# Patient Record
Sex: Male | Born: 1945
Health system: Southern US, Community
[De-identification: ages and names within clinical notes are randomized; demographics above are authoritative.]

## PROBLEM LIST (undated history)

## (undated) DIAGNOSIS — R42 Dizziness and giddiness: Secondary | ICD-10-CM

## (undated) DIAGNOSIS — I10 Essential (primary) hypertension: Secondary | ICD-10-CM

## (undated) DIAGNOSIS — I493 Ventricular premature depolarization: Secondary | ICD-10-CM

## (undated) DIAGNOSIS — I491 Atrial premature depolarization: Secondary | ICD-10-CM

## (undated) HISTORY — DX: Essential (primary) hypertension: I10

## (undated) HISTORY — DX: Atrial premature depolarization: I49.1

## (undated) HISTORY — PX: NO PAST SURGERIES: SHX2092

## (undated) HISTORY — DX: Ventricular premature depolarization: I49.3

---

## 1998-02-19 ENCOUNTER — Ambulatory Visit (HOSPITAL_COMMUNITY): Admission: RE | Admit: 1998-02-19 | Discharge: 1998-02-19 | Payer: Self-pay | Admitting: *Deleted

## 2000-07-17 ENCOUNTER — Emergency Department (HOSPITAL_COMMUNITY): Admission: EM | Admit: 2000-07-17 | Discharge: 2000-07-17 | Payer: Self-pay | Admitting: Emergency Medicine

## 2005-06-02 ENCOUNTER — Ambulatory Visit (HOSPITAL_COMMUNITY): Admission: RE | Admit: 2005-06-02 | Discharge: 2005-06-02 | Payer: Self-pay | Admitting: *Deleted

## 2005-06-02 ENCOUNTER — Encounter (INDEPENDENT_AMBULATORY_CARE_PROVIDER_SITE_OTHER): Payer: Self-pay | Admitting: *Deleted

## 2009-02-26 ENCOUNTER — Encounter: Admission: RE | Admit: 2009-02-26 | Discharge: 2009-02-26 | Payer: Self-pay | Admitting: Internal Medicine

## 2009-06-11 ENCOUNTER — Encounter (INDEPENDENT_AMBULATORY_CARE_PROVIDER_SITE_OTHER): Payer: Self-pay | Admitting: Internal Medicine

## 2009-06-11 ENCOUNTER — Ambulatory Visit (HOSPITAL_COMMUNITY): Admission: RE | Admit: 2009-06-11 | Discharge: 2009-06-11 | Payer: Self-pay | Admitting: Internal Medicine

## 2010-11-14 NOTE — Op Note (Signed)
Patrick, Morgan NO.:  [id number]   MEDICAL RECORD NO.:  [id number]          PATIENT TYPE:  AMB   LOCATION:  ENDO                         FACILITY:  [facility]   PHYSICIAN:  Georgiana Spinner, M.D.    DATE OF BIRTH:  Jan 11, 1946   DATE OF PROCEDURE:  06/02/2005  DATE OF DISCHARGE:                                 OPERATIVE REPORT   PROCEDURE:  Colonoscopy.   INDICATIONS FOR PROCEDURE:  Hemoccult positivity.   ANESTHESIA:  Demerol 25, Versed 2 mg.   PROCEDURE:  With the patient mildly sedated in the left lateral decubitus  position, a rectal exam was performed which was unremarkable.  Subsequently,  the Olympus videoscopic colonoscope was inserted in the rectum and passed  under direct vision to the cecum identified by the ileocecal valve and  appendiceal orifice, both of which were photographed.  From this point, the  colonoscope was slowly withdrawn taking circumferential views of the entire  colonic mucosa stopping at 30 cm from the anal verge at which point a small  polyp was seen, photographed, and removed using hot biopsy forceps at a mix  setting of 20/200 current.  We then withdrew all the way to the rectum which  appeared normal on direct and retroflex view.  The endoscope was  straightened and withdrawn.  The patient's vital signs and pulse oximeter  remained stable.  The patient tolerated the procedure well without apparent  complications.   FINDINGS:  Small polyp at 30 cm from the anal verge, await biopsy report.   PLAN:  The patient will go home and follow up with me as an outpatient.           ______________________________  Georgiana Spinner, M.D.     GMO/MEDQ  D:  06/02/2005  T:  06/02/2005  Job:  [id number]

## 2010-11-14 NOTE — Op Note (Signed)
Patrick Morgan, HORNIG NO.:  [id number]   MEDICAL RECORD NO.:  [id number]          PATIENT TYPE:  AMB   LOCATION:  ENDO                         FACILITY:  [facility]   PHYSICIAN:  Georgiana Spinner, M.D.    DATE OF BIRTH:  Apr 10, 1946   DATE OF PROCEDURE:  06/02/2005  DATE OF DISCHARGE:                                 OPERATIVE REPORT   PROCEDURE:  Upper endoscopy.   INDICATIONS FOR PROCEDURE:  Hemoccult positivity.   ANESTHESIA:  Demerol 75 mg, Versed 6 mg.   PROCEDURE:  With the patient mildly sedated in the left lateral decubitus  position, the Olympus videoscopic endoscope was inserted in the mouth and  passed under direct vision through the esophagus which appeared normal into  the stomach.  The fundus, body, and antrum were visualized and there were  small flecks of blood seen in the stomach and diffuse, mild erythema, which  was photographed and biopsied.  We entered into the duodenal bulb and second  portion of the duodenum and both appeared normal.  From this point, the  endoscope was slowly withdrawn taking circumferential views of the duodenal  mucosa until the endoscope was pulled back into the stomach and placed in  retroflexion to view the stomach from below.  The endoscope was then  straightened and withdrawn taking circumferential views of the remaining  gastric and esophageal mucosa.  The patient's vital signs and pulse oximeter  remained stable.  The patient tolerated the procedure well without apparent  complications.   FINDINGS:  Erythema with some blood flecks in the stomach, may be the cause  of the patient's hemoccult positivity, await biopsy report.  The patient  will call for results and follow up with me as an outpatient.  Proceed to  colonoscopy.           ______________________________  Georgiana Spinner, M.D.     GMO/MEDQ  D:  06/02/2005  T:  06/02/2005  Job:  [id number]

## 2011-07-10 DIAGNOSIS — Z7902 Long term (current) use of antithrombotics/antiplatelets: Secondary | ICD-10-CM | POA: Diagnosis not present

## 2011-07-10 DIAGNOSIS — I10 Essential (primary) hypertension: Secondary | ICD-10-CM | POA: Diagnosis not present

## 2011-07-10 DIAGNOSIS — Z125 Encounter for screening for malignant neoplasm of prostate: Secondary | ICD-10-CM | POA: Diagnosis not present

## 2011-07-15 DIAGNOSIS — Z23 Encounter for immunization: Secondary | ICD-10-CM | POA: Diagnosis not present

## 2011-07-15 DIAGNOSIS — Z0000 Encounter for general adult medical examination without abnormal findings: Secondary | ICD-10-CM | POA: Diagnosis not present

## 2012-03-20 DIAGNOSIS — Z23 Encounter for immunization: Secondary | ICD-10-CM | POA: Diagnosis not present

## 2012-06-09 ENCOUNTER — Encounter: Payer: Self-pay | Admitting: Internal Medicine

## 2012-06-09 ENCOUNTER — Other Ambulatory Visit (INDEPENDENT_AMBULATORY_CARE_PROVIDER_SITE_OTHER): Payer: Medicare Other

## 2012-06-09 ENCOUNTER — Ambulatory Visit (INDEPENDENT_AMBULATORY_CARE_PROVIDER_SITE_OTHER): Payer: Medicare Other | Admitting: Internal Medicine

## 2012-06-09 VITALS — BP 152/84 | HR 84 | Temp 97.6°F | Resp 16 | Ht 70.5 in | Wt 204.0 lb

## 2012-06-09 DIAGNOSIS — I10 Essential (primary) hypertension: Secondary | ICD-10-CM | POA: Diagnosis not present

## 2012-06-09 DIAGNOSIS — R972 Elevated prostate specific antigen [PSA]: Secondary | ICD-10-CM

## 2012-06-09 DIAGNOSIS — M25561 Pain in right knee: Secondary | ICD-10-CM

## 2012-06-09 DIAGNOSIS — M25569 Pain in unspecified knee: Secondary | ICD-10-CM | POA: Diagnosis not present

## 2012-06-09 DIAGNOSIS — Z0000 Encounter for general adult medical examination without abnormal findings: Secondary | ICD-10-CM | POA: Diagnosis not present

## 2012-06-09 DIAGNOSIS — Z23 Encounter for immunization: Secondary | ICD-10-CM | POA: Diagnosis not present

## 2012-06-09 DIAGNOSIS — Z136 Encounter for screening for cardiovascular disorders: Secondary | ICD-10-CM

## 2012-06-09 LAB — CBC WITH DIFFERENTIAL/PLATELET
Basophils Absolute: 0 10*3/uL (ref 0.0–0.1)
Hemoglobin: 14.9 g/dL (ref 13.0–17.0)
Lymphocytes Relative: 18.6 % (ref 12.0–46.0)
Monocytes Relative: 8.1 % (ref 3.0–12.0)
Neutro Abs: 5.5 10*3/uL (ref 1.4–7.7)
Neutrophils Relative %: 71.2 % (ref 43.0–77.0)
RBC: 4.33 Mil/uL (ref 4.22–5.81)
RDW: 13.3 % (ref 11.5–14.6)

## 2012-06-09 LAB — URINALYSIS
Bilirubin Urine: NEGATIVE
Hgb urine dipstick: NEGATIVE
Ketones, ur: NEGATIVE
Total Protein, Urine: NEGATIVE
Urine Glucose: NEGATIVE
Urobilinogen, UA: 0.2 (ref 0.0–1.0)

## 2012-06-09 LAB — LIPID PANEL
HDL: 42.9 mg/dL (ref >39.00)
Triglycerides: 98 mg/dL (ref 0.0–149.0)
VLDL: 19.6 mg/dL (ref 0.0–40.0)

## 2012-06-09 LAB — BASIC METABOLIC PANEL
Calcium: 9.8 mg/dL (ref 8.4–10.5)
GFR: 107.12 mL/min (ref >60.00)
Glucose, Bld: 111 mg/dL — ABNORMAL HIGH (ref 70–99)
Sodium: 137 mEq/L (ref 135–145)

## 2012-06-09 LAB — HEPATIC FUNCTION PANEL
ALT: 25 U/L (ref 0–53)
Albumin: 4.6 g/dL (ref 3.5–5.2)
Total Bilirubin: 0.8 mg/dL (ref 0.3–1.2)
Total Protein: 7.5 g/dL (ref 6.0–8.3)

## 2012-06-09 LAB — TSH: TSH: 1.26 u[IU]/mL (ref 0.35–5.50)

## 2012-06-09 LAB — PSA: PSA: 2.86 ng/mL (ref 0.10–4.00)

## 2012-06-09 MED ORDER — OLMESARTAN MEDOXOMIL 40 MG PO TABS: 40.0000 mg | ORAL_TABLET | Freq: Every day | ORAL | Status: DC

## 2012-06-09 MED ORDER — VITAMIN D 1000 UNITS PO TABS: 1000.0000 [IU] | ORAL_TABLET | Freq: Every day | ORAL | Status: AC

## 2012-06-09 MED ORDER — IBUPROFEN 600 MG PO TABS: 600.0000 mg | ORAL_TABLET | Freq: Three times a day (TID) | ORAL | Status: DC | PRN

## 2012-06-09 NOTE — Assessment & Plan Note (Signed)
Here for medicare wellness/physical  Diet: heart healthy  Physical activity: active Depression/mood screen: negative  Hearing: intact to whispered voice  Visual acuity: grossly normal, performs annual eye exam  ADLs: capable  Fall risk: none  Home safety: good  Cognitive evaluation: intact to orientation, naming, recall and repetition  EOL planning: adv directives, full code/ I agree  I have personally reviewed and have noted  1. The patient's medical and social history  2. Their use of alcohol, tobacco or illicit drugs  3. Their current medications and supplements  4. The patient's functional ability including ADL's, fall risks, home safety risks and hearing or visual impairment.  5. Diet and physical activities  6. Evidence for depression or mood disorders    Today patient counseled on age appropriate routine health concerns for screening and prevention, each reviewed and up to date or declined. Immunizations reviewed and up to date or declined. Labs ordered and reviewed. Risk factors for depression reviewed and negative. Hearing function and visual acuity are intact. ADLs screened and addressed as needed. Functional ability and level of safety reviewed and appropriate. Education, counseling and referrals performed based on assessed risks today. Patient provided with a copy of personalized plan for preventive services.     

## 2012-06-09 NOTE — Assessment & Plan Note (Signed)
Chronic - nl BP at home Continue with current prescription therapy as reflected on the Med list.

## 2012-06-09 NOTE — Progress Notes (Signed)
  Subjective:    Patient ID: Patrick Morgan, male    DOB: 10/17/1945, 66 y.o.   MRN: [id number]  HPI  The patient is here for a wellness exam. The patient has been doing well overall without major physical or psychological issues going on lately. C/o OA, knee pain F/u HTN  BP Readings from Last 3 Encounters:  06/09/12 152/84   Wt Readings from Last 3 Encounters:  06/09/12 204 lb (92.534 kg)      Review of Systems  Constitutional: Negative for appetite change, fatigue and unexpected weight change.  HENT: Negative for nosebleeds, congestion, sore throat, sneezing, trouble swallowing and neck pain.   Eyes: Negative for itching and visual disturbance.  Respiratory: Negative for cough.   Cardiovascular: Negative for chest pain, palpitations and leg swelling.  Gastrointestinal: Negative for nausea, diarrhea, blood in stool and abdominal distention.  Genitourinary: Negative for frequency and hematuria.  Musculoskeletal: Negative for back pain, joint swelling and gait problem.  Skin: Negative for rash.  Neurological: Negative for dizziness, tremors, speech difficulty and weakness.  Psychiatric/Behavioral: Negative for suicidal ideas, sleep disturbance, dysphoric mood and agitation. The patient is not nervous/anxious.        Objective:   Physical Exam  Constitutional: He is oriented to person, place, and time. He appears well-developed and well-nourished. No distress.  HENT:  Head: Normocephalic and atraumatic.  Right Ear: External ear normal.  Left Ear: External ear normal.  Nose: Nose normal.  Mouth/Throat: Oropharynx is clear and moist. No oropharyngeal exudate.  Eyes: Conjunctivae normal and EOM are normal. Pupils are equal, round, and reactive to light. Right eye exhibits no discharge. Left eye exhibits no discharge. No scleral icterus.  Neck: Normal range of motion. Neck supple. No JVD present. No tracheal deviation present. No thyromegaly present.  Cardiovascular: Normal  rate, regular rhythm, normal heart sounds and intact distal pulses.  Exam reveals no gallop and no friction rub.   No murmur heard. Pulmonary/Chest: Effort normal and breath sounds normal. No stridor. No respiratory distress. He has no wheezes. He has no rales. He exhibits no tenderness.  Abdominal: Soft. Bowel sounds are normal. He exhibits no distension and no mass. There is no tenderness. There is no rebound and no guarding.  Genitourinary: Rectum normal, prostate normal and penis normal. Guaiac negative stool. No penile tenderness.  Musculoskeletal: Normal range of motion. He exhibits no edema and no tenderness.  Lymphadenopathy:    He has no cervical adenopathy.  Neurological: He is alert and oriented to person, place, and time. He has normal reflexes. No cranial nerve deficit. He exhibits normal muscle tone. Coordination normal.  Skin: Skin is warm and dry. No rash noted. He is not diaphoretic. No erythema. No pallor.  Psychiatric: He has a normal mood and affect. His behavior is normal. Judgment and thought content normal.   No results found for this basename: WBC, HGB, HCT, PLT, GLUCOSE, CHOL, TRIG, HDL, LDLDIRECT, LDLCALC, ALT, AST, NA, K, CL, CREATININE, BUN, CO2, TSH, PSA, INR, GLUF, HGBA1C, MICROALBUR          Assessment & Plan:

## 2013-02-01 ENCOUNTER — Other Ambulatory Visit: Payer: Self-pay

## 2013-04-05 DIAGNOSIS — Z23 Encounter for immunization: Secondary | ICD-10-CM | POA: Diagnosis not present

## 2013-06-14 ENCOUNTER — Other Ambulatory Visit (INDEPENDENT_AMBULATORY_CARE_PROVIDER_SITE_OTHER): Payer: Medicare Other

## 2013-06-14 ENCOUNTER — Encounter: Payer: Self-pay | Admitting: Internal Medicine

## 2013-06-14 ENCOUNTER — Ambulatory Visit (INDEPENDENT_AMBULATORY_CARE_PROVIDER_SITE_OTHER): Payer: Medicare Other | Admitting: Internal Medicine

## 2013-06-14 VITALS — BP 148/70 | Temp 98.0°F | Ht 70.5 in | Wt 202.0 lb

## 2013-06-14 DIAGNOSIS — I10 Essential (primary) hypertension: Secondary | ICD-10-CM

## 2013-06-14 DIAGNOSIS — Z0000 Encounter for general adult medical examination without abnormal findings: Secondary | ICD-10-CM

## 2013-06-14 DIAGNOSIS — Z136 Encounter for screening for cardiovascular disorders: Secondary | ICD-10-CM

## 2013-06-14 DIAGNOSIS — R209 Unspecified disturbances of skin sensation: Secondary | ICD-10-CM

## 2013-06-14 DIAGNOSIS — R972 Elevated prostate specific antigen [PSA]: Secondary | ICD-10-CM | POA: Diagnosis not present

## 2013-06-14 DIAGNOSIS — R202 Paresthesia of skin: Secondary | ICD-10-CM

## 2013-06-14 DIAGNOSIS — Z23 Encounter for immunization: Secondary | ICD-10-CM | POA: Diagnosis not present

## 2013-06-14 DIAGNOSIS — E785 Hyperlipidemia, unspecified: Secondary | ICD-10-CM | POA: Diagnosis not present

## 2013-06-14 LAB — LIPID PANEL
LDL Cholesterol: 123 mg/dL — ABNORMAL HIGH (ref 0–99)
Total CHOL/HDL Ratio: 4
VLDL: 9.4 mg/dL (ref 0.0–40.0)

## 2013-06-14 LAB — CBC WITH DIFFERENTIAL/PLATELET
Basophils Relative: 0.5 % (ref 0.0–3.0)
Eosinophils Absolute: 0.1 10*3/uL (ref 0.0–0.7)
Eosinophils Relative: 1.3 % (ref 0.0–5.0)
HCT: 45.5 % (ref 39.0–52.0)
Lymphs Abs: 1.3 10*3/uL (ref 0.7–4.0)
MCHC: 33.8 g/dL (ref 30.0–36.0)
MCV: 101.1 fl — ABNORMAL HIGH (ref 78.0–100.0)
Monocytes Absolute: 0.4 10*3/uL (ref 0.1–1.0)
Neutrophils Relative %: 70.1 % (ref 43.0–77.0)
Platelets: 193 10*3/uL (ref 150.0–400.0)
RBC: 4.5 Mil/uL (ref 4.22–5.81)
WBC: 5.9 10*3/uL (ref 4.5–10.5)

## 2013-06-14 LAB — BASIC METABOLIC PANEL
BUN: 22 mg/dL (ref 6–23)
CO2: 25 mEq/L (ref 19–32)
Chloride: 107 mEq/L (ref 96–112)
Creatinine, Ser: 0.8 mg/dL (ref 0.4–1.5)
Potassium: 3.8 mEq/L (ref 3.5–5.1)

## 2013-06-14 LAB — URINALYSIS
Nitrite: NEGATIVE
Total Protein, Urine: NEGATIVE
Urine Glucose: NEGATIVE
pH: 6 (ref 5.0–8.0)

## 2013-06-14 LAB — HEPATIC FUNCTION PANEL
Alkaline Phosphatase: 78 U/L (ref 39–117)
Bilirubin, Direct: 0.1 mg/dL (ref 0.0–0.3)

## 2013-06-14 LAB — PSA, TOTAL AND FREE: PSA, Free: 0.35 ng/mL

## 2013-06-14 MED ORDER — OLMESARTAN MEDOXOMIL 40 MG PO TABS: 40.0000 mg | ORAL_TABLET | Freq: Every day | ORAL | Status: DC

## 2013-06-14 MED ORDER — VITAMIN D 1000 UNITS PO TABS: 1000.0000 [IU] | ORAL_TABLET | Freq: Every day | ORAL | Status: AC

## 2013-06-14 NOTE — Progress Notes (Signed)
   Subjective:    HPI  The patient is here for a wellness exam. The patient has been doing well overall without major physical or psychological issues going on lately. C/o R big toe, foot bottom and shin numbness at times x 1 mo  F/u HTN  BP Readings from Last 3 Encounters:  06/14/13 148/70  06/09/12 152/84   Wt Readings from Last 3 Encounters:  06/14/13 202 lb (91.627 kg)  06/09/12 204 lb (92.534 kg)      Review of Systems  Constitutional: Negative for appetite change, fatigue and unexpected weight change.  HENT: Negative for congestion, nosebleeds, sneezing, sore throat and trouble swallowing.   Eyes: Negative for itching and visual disturbance.  Respiratory: Negative for cough.   Cardiovascular: Negative for chest pain, palpitations and leg swelling.  Gastrointestinal: Negative for nausea, diarrhea, blood in stool and abdominal distention.  Genitourinary: Negative for frequency and hematuria.  Musculoskeletal: Negative for back pain, gait problem, joint swelling and neck pain.  Skin: Negative for rash.  Neurological: Negative for dizziness, tremors, speech difficulty and weakness.  Psychiatric/Behavioral: Negative for suicidal ideas, sleep disturbance, dysphoric mood and agitation. The patient is not nervous/anxious.        Objective:   Physical Exam  Constitutional: He is oriented to person, place, and time. He appears well-developed and well-nourished. No distress.  HENT:  Head: Normocephalic and atraumatic.  Right Ear: External ear normal.  Left Ear: External ear normal.  Nose: Nose normal.  Mouth/Throat: Oropharynx is clear and moist. No oropharyngeal exudate.  Eyes: Conjunctivae and EOM are normal. Pupils are equal, round, and reactive to light. Right eye exhibits no discharge. Left eye exhibits no discharge. No scleral icterus.  Neck: Normal range of motion. Neck supple. No JVD present. No tracheal deviation present. No thyromegaly present.  Cardiovascular:  Normal rate, regular rhythm, normal heart sounds and intact distal pulses.  Exam reveals no gallop and no friction rub.   No murmur heard. Pulmonary/Chest: Effort normal and breath sounds normal. No stridor. No respiratory distress. He has no wheezes. He has no rales. He exhibits no tenderness.  Abdominal: Soft. Bowel sounds are normal. He exhibits no distension and no mass. There is no tenderness. There is no rebound and no guarding.  Genitourinary: Rectum normal, prostate normal and penis normal. Guaiac negative stool. No penile tenderness.  Musculoskeletal: Normal range of motion. He exhibits no edema and no tenderness.  Lymphadenopathy:    He has no cervical adenopathy.  Neurological: He is alert and oriented to person, place, and time. He has normal reflexes. No cranial nerve deficit. He exhibits normal muscle tone. Coordination normal.  Skin: Skin is warm and dry. No rash noted. He is not diaphoretic. No erythema. No pallor.  Psychiatric: He has a normal mood and affect. His behavior is normal. Judgment and thought content normal.   Lab Results  Component Value Date   WBC 7.7 06/09/2012          Assessment & Plan:

## 2013-06-14 NOTE — Assessment & Plan Note (Addendum)
12/14 R peroneal nerve distribution - ?pressure induced Relief pressure when driving

## 2013-06-14 NOTE — Assessment & Plan Note (Addendum)
Here for medicare wellness/physical  Diet: heart healthy  Physical activity: active Depression/mood screen: negative  Hearing: intact to whispered voice  Visual acuity: grossly normal, performs annual eye exam  ADLs: capable  Fall risk: none  Home safety: good  Cognitive evaluation: intact to orientation, naming, recall and repetition  EOL planning: adv directives, full code/ I agree  I have personally reviewed and have noted  1. The patient's medical and social history  2. Their use of alcohol, tobacco or illicit drugs  3. Their current medications and supplements  4. The patient's functional ability including ADL's, fall risks, home safety risks and hearing or visual impairment.  5. Diet and physical activities  6. Evidence for depression or mood disorders    Today patient counseled on age appropriate routine health concerns for screening and prevention, each reviewed and up to date or declined. Immunizations reviewed and up to date or declined. Labs ordered and reviewed. Risk factors for depression reviewed and negative. Hearing function and visual acuity are intact. ADLs screened and addressed as needed. Functional ability and level of safety reviewed and appropriate. Education, counseling and referrals performed based on assessed risks today. Patient provided with a copy of personalized plan for preventive services.   Colon w/Dr Loreta Ave

## 2013-06-14 NOTE — Progress Notes (Signed)
Pre visit review using our clinic review tool, if applicable. No additional management support is needed unless otherwise documented below in the visit note. 

## 2013-06-15 ENCOUNTER — Encounter: Payer: Self-pay | Admitting: Internal Medicine

## 2013-06-15 NOTE — Assessment & Plan Note (Signed)
Continue with current prescription therapy as reflected on the Med list.  

## 2013-06-15 NOTE — Assessment & Plan Note (Signed)
PSA and free PSA Urol f/u was recomended

## 2013-09-15 ENCOUNTER — Other Ambulatory Visit: Payer: Self-pay | Admitting: Internal Medicine

## 2013-09-15 NOTE — Telephone Encounter (Signed)
Pt called stated that benicar is too expensive. Pt request for Dr. Camila Li to send something that is cheaper. The [facility] recommend Losartin and Hyzaar. Please advise. Pt also request sample for Benicar for the time being.

## 2013-09-17 NOTE — Telephone Encounter (Signed)
Ok Micardis Thx

## 2013-09-18 MED ORDER — TELMISARTAN 80 MG PO TABS: 80.0000 mg | ORAL_TABLET | Freq: Every day | ORAL | Status: DC

## 2013-09-19 ENCOUNTER — Telehealth: Payer: Self-pay

## 2013-09-19 NOTE — Telephone Encounter (Signed)
Received pharmacy rejection stating that insurance will not cover micarwithout a prior authorization. Preferred alternatives are benicar, exforge, azor or tribenzor. Pt has failed benicar, and PA submitted.

## 2014-03-23 DIAGNOSIS — Z23 Encounter for immunization: Secondary | ICD-10-CM | POA: Diagnosis not present

## 2014-06-15 ENCOUNTER — Other Ambulatory Visit (INDEPENDENT_AMBULATORY_CARE_PROVIDER_SITE_OTHER): Payer: Medicare Other

## 2014-06-15 ENCOUNTER — Ambulatory Visit (INDEPENDENT_AMBULATORY_CARE_PROVIDER_SITE_OTHER): Payer: Medicare Other | Admitting: Internal Medicine

## 2014-06-15 ENCOUNTER — Encounter: Payer: Self-pay | Admitting: Internal Medicine

## 2014-06-15 VITALS — BP 180/88 | HR 87 | Temp 98.1°F | Ht 70.0 in | Wt 211.0 lb

## 2014-06-15 DIAGNOSIS — E785 Hyperlipidemia, unspecified: Secondary | ICD-10-CM

## 2014-06-15 DIAGNOSIS — Z0000 Encounter for general adult medical examination without abnormal findings: Secondary | ICD-10-CM | POA: Diagnosis not present

## 2014-06-15 DIAGNOSIS — I10 Essential (primary) hypertension: Secondary | ICD-10-CM

## 2014-06-15 DIAGNOSIS — N320 Bladder-neck obstruction: Secondary | ICD-10-CM

## 2014-06-15 LAB — CBC WITH DIFFERENTIAL/PLATELET
Basophils Absolute: 0 10*3/uL (ref 0.0–0.1)
Basophils Relative: 0.5 % (ref 0.0–3.0)
EOS PCT: 0.9 % (ref 0.0–5.0)
Eosinophils Absolute: 0.1 10*3/uL (ref 0.0–0.7)
HCT: 46.7 % (ref 39.0–52.0)
HEMOGLOBIN: 15.8 g/dL (ref 13.0–17.0)
Lymphocytes Relative: 17.5 % (ref 12.0–46.0)
Lymphs Abs: 1.3 10*3/uL (ref 0.7–4.0)
MCHC: 33.9 g/dL (ref 30.0–36.0)
MCV: 100.7 fl — AB (ref 78.0–100.0)
MONO ABS: 0.5 10*3/uL (ref 0.1–1.0)
Monocytes Relative: 7 % (ref 3.0–12.0)
Neutro Abs: 5.5 10*3/uL (ref 1.4–7.7)
Neutrophils Relative %: 74.1 % (ref 43.0–77.0)
Platelets: 192 10*3/uL (ref 150.0–400.0)
RBC: 4.64 Mil/uL (ref 4.22–5.81)
RDW: 13 % (ref 11.5–15.5)
WBC: 7.4 10*3/uL (ref 4.0–10.5)

## 2014-06-15 LAB — LIPID PANEL
Cholesterol: 188 mg/dL (ref 0–200)
HDL: 39.5 mg/dL (ref >39.00)
LDL Cholesterol: 135 mg/dL — ABNORMAL HIGH (ref 0–99)
NonHDL: 148.5
TRIGLYCERIDES: 70 mg/dL (ref 0.0–149.0)
Total CHOL/HDL Ratio: 5
VLDL: 14 mg/dL (ref 0.0–40.0)

## 2014-06-15 LAB — HEPATIC FUNCTION PANEL
ALBUMIN: 4.4 g/dL (ref 3.5–5.2)
ALK PHOS: 67 U/L (ref 39–117)
ALT: 25 U/L (ref 0–53)
AST: 25 U/L (ref 0–37)
Bilirubin, Direct: 0.1 mg/dL (ref 0.0–0.3)
Total Bilirubin: 0.7 mg/dL (ref 0.2–1.2)
Total Protein: 7.2 g/dL (ref 6.0–8.3)

## 2014-06-15 LAB — URINALYSIS
BILIRUBIN URINE: NEGATIVE
HGB URINE DIPSTICK: NEGATIVE
KETONES UR: NEGATIVE
LEUKOCYTES UA: NEGATIVE
Nitrite: NEGATIVE
PH: 6 (ref 5.0–8.0)
Specific Gravity, Urine: 1.025 (ref 1.000–1.030)
Total Protein, Urine: NEGATIVE
Urine Glucose: NEGATIVE
Urobilinogen, UA: 0.2 (ref 0.0–1.0)

## 2014-06-15 LAB — BASIC METABOLIC PANEL
BUN: 21 mg/dL (ref 6–23)
CO2: 25 mEq/L (ref 19–32)
Calcium: 9.5 mg/dL (ref 8.4–10.5)
Chloride: 105 mEq/L (ref 96–112)
Creatinine, Ser: 0.8 mg/dL (ref 0.4–1.5)
GFR: 121.78 mL/min (ref >60.00)
GLUCOSE: 90 mg/dL (ref 70–99)
Potassium: 4.3 mEq/L (ref 3.5–5.1)
SODIUM: 139 meq/L (ref 135–145)

## 2014-06-15 MED ORDER — TELMISARTAN 80 MG PO TABS: 80.0000 mg | ORAL_TABLET | Freq: Every day | ORAL | Status: DC

## 2014-06-15 MED ORDER — OLMESARTAN MEDOXOMIL 40 MG PO TABS: 40.0000 mg | ORAL_TABLET | Freq: Every day | ORAL | Status: DC

## 2014-06-15 NOTE — Progress Notes (Signed)
   Subjective:    HPI  The patient is here for a wellness exam. The patient has been doing well overall without major physical or psychological issues going on lately. F/u R big toe, foot bottom and shin numbness at times x 1 mo  F/u HTN - BP is ok at home Colon Dr Collene Mares due in 2016  BP 130/85  BP Readings from Last 3 Encounters:  06/15/14 180/88  06/14/13 148/70  06/09/12 152/84   Wt Readings from Last 3 Encounters:  06/15/14 211 lb (95.709 kg)  06/14/13 202 lb (91.627 kg)  06/09/12 204 lb (92.534 kg)      Review of Systems  Constitutional: Negative for appetite change, fatigue and unexpected weight change.  HENT: Negative for congestion, nosebleeds, sneezing, sore throat and trouble swallowing.   Eyes: Negative for itching and visual disturbance.  Respiratory: Negative for cough.   Cardiovascular: Negative for chest pain, palpitations and leg swelling.  Gastrointestinal: Negative for nausea, diarrhea, blood in stool and abdominal distention.  Genitourinary: Negative for frequency and hematuria.  Musculoskeletal: Negative for back pain, joint swelling, gait problem and neck pain.  Skin: Negative for rash.  Neurological: Negative for dizziness, tremors, speech difficulty and weakness.  Psychiatric/Behavioral: Negative for suicidal ideas, sleep disturbance, dysphoric mood and agitation. The patient is not nervous/anxious.        Objective:   Physical Exam  Constitutional: He is oriented to person, place, and time. He appears well-developed and well-nourished. No distress.  HENT:  Head: Normocephalic and atraumatic.  Right Ear: External ear normal.  Left Ear: External ear normal.  Nose: Nose normal.  Mouth/Throat: Oropharynx is clear and moist. No oropharyngeal exudate.  Eyes: Conjunctivae and EOM are normal. Pupils are equal, round, and reactive to light. Right eye exhibits no discharge. Left eye exhibits no discharge. No scleral icterus.  Neck: Normal range of  motion. Neck supple. No JVD present. No tracheal deviation present. No thyromegaly present.  Cardiovascular: Normal rate, regular rhythm, normal heart sounds and intact distal pulses.  Exam reveals no gallop and no friction rub.   No murmur heard. Pulmonary/Chest: Effort normal and breath sounds normal. No stridor. No respiratory distress. He has no wheezes. He has no rales. He exhibits no tenderness.  Abdominal: Soft. Bowel sounds are normal. He exhibits no distension and no mass. There is no tenderness. There is no rebound and no guarding.  Musculoskeletal: Normal range of motion. He exhibits no edema or tenderness.  Lymphadenopathy:    He has no cervical adenopathy.  Neurological: He is alert and oriented to person, place, and time. He has normal reflexes. No cranial nerve deficit. He exhibits normal muscle tone. Coordination normal.  Skin: Skin is warm and dry. No rash noted. He is not diaphoretic. No erythema. No pallor.  Psychiatric: He has a normal mood and affect. His behavior is normal. Judgment and thought content normal.  declined rectal Lab Results  Component Value Date   WBC 5.9 06/14/2013          Assessment & Plan:

## 2014-06-15 NOTE — Progress Notes (Signed)
Pre visit review using our clinic review tool, if applicable. No additional management support is needed unless otherwise documented below in the visit note. 

## 2014-06-15 NOTE — Patient Instructions (Signed)
Preventive Care for Adults A healthy lifestyle and preventive care can promote health and wellness. Preventive health guidelines for men include the following key practices:  A routine yearly physical is a good way to check with your health care provider about your health and preventative screening. It is a chance to share any concerns and updates on your health and to receive a thorough exam.  Visit your dentist for a routine exam and preventative care every 6 months. Brush your teeth twice a day and floss once a day. Good oral hygiene prevents tooth decay and gum disease.  The frequency of eye exams is based on your age, health, family medical history, use of contact lenses, and other factors. Follow your health care provider's recommendations for frequency of eye exams.  Eat a healthy diet. Foods such as vegetables, fruits, whole grains, low-fat dairy products, and lean protein foods contain the nutrients you need without too many calories. Decrease your intake of foods high in solid fats, added sugars, and salt. Eat the right amount of calories for you.Get information about a proper diet from your health care provider, if necessary.  Regular physical exercise is one of the most important things you can do for your health. Most adults should get at least 150 minutes of moderate-intensity exercise (any activity that increases your heart rate and causes you to sweat) each week. In addition, most adults need muscle-strengthening exercises on 2 or more days a week.  Maintain a healthy weight. The body mass index (BMI) is a screening tool to identify possible weight problems. It provides an estimate of body fat based on height and weight. Your health care provider can find your BMI and can help you achieve or maintain a healthy weight.For adults 20 years and older:  A BMI below 18.5 is considered underweight.  A BMI of 18.5 to 24.9 is normal.  A BMI of 25 to 29.9 is considered overweight.  A BMI  of 30 and above is considered obese.  Maintain normal blood lipids and cholesterol levels by exercising and minimizing your intake of saturated fat. Eat a balanced diet with plenty of fruit and vegetables. Blood tests for lipids and cholesterol should begin at age 50 and be repeated every 5 years. If your lipid or cholesterol levels are high, you are over 50, or you are at high risk for heart disease, you may need your cholesterol levels checked more frequently.Ongoing high lipid and cholesterol levels should be treated with medicines if diet and exercise are not working.  If you smoke, find out from your health care provider how to quit. If you do not use tobacco, do not start.  Lung cancer screening is recommended for adults aged 73-80 years who are at high risk for developing lung cancer because of a history of smoking. A yearly low-dose CT scan of the lungs is recommended for people who have at least a 30-pack-year history of smoking and are a current smoker or have quit within the past 15 years. A pack year of smoking is smoking an average of 1 pack of cigarettes a day for 1 year (for example: 1 pack a day for 30 years or 2 packs a day for 15 years). Yearly screening should continue until the smoker has stopped smoking for at least 15 years. Yearly screening should be stopped for people who develop a health problem that would prevent them from having lung cancer treatment.  If you choose to drink alcohol, do not have more than  2 drinks per day. One drink is considered to be 12 ounces (355 mL) of beer, 5 ounces (148 mL) of wine, or 1.5 ounces (44 mL) of liquor.  Avoid use of street drugs. Do not share needles with anyone. Ask for help if you need support or instructions about stopping the use of drugs.  High blood pressure causes heart disease and increases the risk of stroke. Your blood pressure should be checked at least every 1-2 years. Ongoing high blood pressure should be treated with  medicines, if weight loss and exercise are not effective.  If you are 45-79 years old, ask your health care provider if you should take aspirin to prevent heart disease.  Diabetes screening involves taking a blood sample to check your fasting blood sugar level. This should be done once every 3 years, after age 45, if you are within normal weight and without risk factors for diabetes. Testing should be considered at a younger age or be carried out more frequently if you are overweight and have at least 1 risk factor for diabetes.  Colorectal cancer can be detected and often prevented. Most routine colorectal cancer screening begins at the age of 50 and continues through age 75. However, your health care provider may recommend screening at an earlier age if you have risk factors for colon cancer. On a yearly basis, your health care provider may provide home test kits to check for hidden blood in the stool. Use of a small camera at the end of a tube to directly examine the colon (sigmoidoscopy or colonoscopy) can detect the earliest forms of colorectal cancer. Talk to your health care provider about this at age 50, when routine screening begins. Direct exam of the colon should be repeated every 5-10 years through age 75, unless early forms of precancerous polyps or small growths are found.  People who are at an increased risk for hepatitis B should be screened for this virus. You are considered at high risk for hepatitis B if:  You were born in a country where hepatitis B occurs often. Talk with your health care provider about which countries are considered high risk.  Your parents were born in a high-risk country and you have not received a shot to protect against hepatitis B (hepatitis B vaccine).  You have HIV or AIDS.  You use needles to inject street drugs.  You live with, or have sex with, someone who has hepatitis B.  You are a man who has sex with other men (MSM).  You get hemodialysis  treatment.  You take certain medicines for conditions such as cancer, organ transplantation, and autoimmune conditions.  Hepatitis C blood testing is recommended for all people born from 1945 through 1965 and any individual with known risks for hepatitis C.  Practice safe sex. Use condoms and avoid high-risk sexual practices to reduce the spread of sexually transmitted infections (STIs). STIs include gonorrhea, chlamydia, syphilis, trichomonas, herpes, HPV, and human immunodeficiency virus (HIV). Herpes, HIV, and HPV are viral illnesses that have no cure. They can result in disability, cancer, and death.  If you are at risk of being infected with HIV, it is recommended that you take a prescription medicine daily to prevent HIV infection. This is called preexposure prophylaxis (PrEP). You are considered at risk if:  You are a man who has sex with other men (MSM) and have other risk factors.  You are a heterosexual man, are sexually active, and are at increased risk for HIV infection.    You take drugs by injection.  You are sexually active with a partner who has HIV.  Talk with your health care provider about whether you are at high risk of being infected with HIV. If you choose to begin PrEP, you should first be tested for HIV. You should then be tested every 3 months for as long as you are taking PrEP.  A one-time screening for abdominal aortic aneurysm (AAA) and surgical repair of large AAAs by ultrasound are recommended for men ages 32 to 67 years who are current or former smokers.  Healthy men should no longer receive prostate-specific antigen (PSA) blood tests as part of routine cancer screening. Talk with your health care provider about prostate cancer screening.  Testicular cancer screening is not recommended for adult males who have no symptoms. Screening includes self-exam, a health care provider exam, and other screening tests. Consult with your health care provider about any symptoms  you have or any concerns you have about testicular cancer.  Use sunscreen. Apply sunscreen liberally and repeatedly throughout the day. You should seek shade when your shadow is shorter than you. Protect yourself by wearing long sleeves, pants, a wide-brimmed hat, and sunglasses year round, whenever you are outdoors.  Once a month, do a whole-body skin exam, using a mirror to look at the skin on your back. Tell your health care provider about new moles, moles that have irregular borders, moles that are larger than a pencil eraser, or moles that have changed in shape or color.  Stay current with required vaccines (immunizations).  Influenza vaccine. All adults should be immunized every year.  Tetanus, diphtheria, and acellular pertussis (Td, Tdap) vaccine. An adult who has not previously received Tdap or who does not know his vaccine status should receive 1 dose of Tdap. This initial dose should be followed by tetanus and diphtheria toxoids (Td) booster doses every 10 years. Adults with an unknown or incomplete history of completing a 3-dose immunization series with Td-containing vaccines should begin or complete a primary immunization series including a Tdap dose. Adults should receive a Td booster every 10 years.  Varicella vaccine. An adult without evidence of immunity to varicella should receive 2 doses or a second dose if he has previously received 1 dose.  Human papillomavirus (HPV) vaccine. Males aged 68-21 years who have not received the vaccine previously should receive the 3-dose series. Males aged 22-26 years may be immunized. Immunization is recommended through the age of 6 years for any male who has sex with males and did not get any or all doses earlier. Immunization is recommended for any person with an immunocompromised condition through the age of 49 years if he did not get any or all doses earlier. During the 3-dose series, the second dose should be obtained 4-8 weeks after the first  dose. The third dose should be obtained 24 weeks after the first dose and 16 weeks after the second dose.  Zoster vaccine. One dose is recommended for adults aged 50 years or older unless certain conditions are present.  Measles, mumps, and rubella (MMR) vaccine. Adults born before [birth year] generally are considered immune to measles and mumps. Adults born in [birth year] or later should have 1 or more doses of MMR vaccine unless there is a contraindication to the vaccine or there is laboratory evidence of immunity to each of the three diseases. A routine second dose of MMR vaccine should be obtained at least 28 days after the first dose for students attending postsecondary  schools, health care workers, or international travelers. People who received inactivated measles vaccine or an unknown type of measles vaccine during 1963-1967 should receive 2 doses of MMR vaccine. People who received inactivated mumps vaccine or an unknown type of mumps vaccine before 1979 and are at high risk for mumps infection should consider immunization with 2 doses of MMR vaccine. Unvaccinated health care workers born before [birth year] who lack laboratory evidence of measles, mumps, or rubella immunity or laboratory confirmation of disease should consider measles and mumps immunization with 2 doses of MMR vaccine or rubella immunization with 1 dose of MMR vaccine.  Pneumococcal 13-valent conjugate (PCV13) vaccine. When indicated, a person who is uncertain of his immunization history and has no record of immunization should receive the PCV13 vaccine. An adult aged 5 years or older who has certain medical conditions and has not been previously immunized should receive 1 dose of PCV13 vaccine. This PCV13 should be followed with a dose of pneumococcal polysaccharide (PPSV23) vaccine. The PPSV23 vaccine dose should be obtained at least 8 weeks after the dose of PCV13 vaccine. An adult aged 18 years or older who has certain medical conditions and  previously received 1 or more doses of PPSV23 vaccine should receive 1 dose of PCV13. The PCV13 vaccine dose should be obtained 1 or more years after the last PPSV23 vaccine dose.  Pneumococcal polysaccharide (PPSV23) vaccine. When PCV13 is also indicated, PCV13 should be obtained first. All adults aged 86 years and older should be immunized. An adult younger than age 26 years who has certain medical conditions should be immunized. Any person who resides in a nursing home or long-term care facility should be immunized. An adult smoker should be immunized. People with an immunocompromised condition and certain other conditions should receive both PCV13 and PPSV23 vaccines. People with human immunodeficiency virus (HIV) infection should be immunized as soon as possible after diagnosis. Immunization during chemotherapy or radiation therapy should be avoided. Routine use of PPSV23 vaccine is not recommended for American Indians, [place] Natives, or people younger than 65 years unless there are medical conditions that require PPSV23 vaccine. When indicated, people who have unknown immunization and have no record of immunization should receive PPSV23 vaccine. One-time revaccination 5 years after the first dose of PPSV23 is recommended for people aged 19-64 years who have chronic kidney failure, nephrotic syndrome, asplenia, or immunocompromised conditions. People who received 1-2 doses of PPSV23 before age 28 years should receive another dose of PPSV23 vaccine at age 57 years or later if at least 5 years have passed since the previous dose. Doses of PPSV23 are not needed for people immunized with PPSV23 at or after age 78 years.  Meningococcal vaccine. Adults with asplenia or persistent complement component deficiencies should receive 2 doses of quadrivalent meningococcal conjugate (MenACWY-D) vaccine. The doses should be obtained at least 2 months apart. Microbiologists working with certain meningococcal bacteria,  [employer] recruits, people at risk during an outbreak, and people who travel to or live in countries with a high rate of meningitis should be immunized. A first-year college student up through age 1 years who is living in a residence hall should receive a dose if he did not receive a dose on or after his 16th birthday. Adults who have certain high-risk conditions should receive one or more doses of vaccine.  Hepatitis A vaccine. Adults who wish to be protected from this disease, have certain high-risk conditions, work with hepatitis A-infected animals, work in hepatitis A research labs, or  travel to or work in countries with a high rate of hepatitis A should be immunized. Adults who were previously unvaccinated and who anticipate close contact with an international adoptee during the first 60 days after arrival in the [place] from a country with a high rate of hepatitis A should be immunized.  Hepatitis B vaccine. Adults should be immunized if they wish to be protected from this disease, have certain high-risk conditions, may be exposed to blood or other infectious body fluids, are household contacts or sex partners of hepatitis B positive people, are clients or workers in certain care facilities, or travel to or work in countries with a high rate of hepatitis B.  Haemophilus influenzae type b (Hib) vaccine. A previously unvaccinated person with asplenia or sickle cell disease or having a scheduled splenectomy should receive 1 dose of Hib vaccine. Regardless of previous immunization, a recipient of a hematopoietic stem cell transplant should receive a 3-dose series 6-12 months after his successful transplant. Hib vaccine is not recommended for adults with HIV infection. Preventive Service / Frequency Ages 31 to 56  Blood pressure check.** / Every 1 to 2 years.  Lipid and cholesterol check.** / Every 5 years beginning at age 35.  Hepatitis C blood test.** / For any individual with known risks for  hepatitis C.  Skin self-exam. / Monthly.  Influenza vaccine. / Every year.  Tetanus, diphtheria, and acellular pertussis (Tdap, Td) vaccine.** / Consult your health care provider. 1 dose of Td every 10 years.  Varicella vaccine.** / Consult your health care provider.  HPV vaccine. / 3 doses over 6 months, if 75 or younger.  Measles, mumps, rubella (MMR) vaccine.** / You need at least 1 dose of MMR if you were born in 1957 or later. You may also need a second dose.  Pneumococcal 13-valent conjugate (PCV13) vaccine.** / Consult your health care provider.  Pneumococcal polysaccharide (PPSV23) vaccine.** / 1 to 2 doses if you smoke cigarettes or if you have certain conditions.  Meningococcal vaccine.** / 1 dose if you are age 53 to 90 years and a Market researcher living in a residence hall, or have one of several medical conditions. You may also need additional booster doses.  Hepatitis A vaccine.** / Consult your health care provider.  Hepatitis B vaccine.** / Consult your health care provider.  Haemophilus influenzae type b (Hib) vaccine.** / Consult your health care provider. Ages 11 to 31  Blood pressure check.** / Every 1 to 2 years.  Lipid and cholesterol check.** / Every 5 years beginning at age 69.  Lung cancer screening. / Every year if you are aged 17-80 years and have a 30-pack-year history of smoking and currently smoke or have quit within the past 15 years. Yearly screening is stopped once you have quit smoking for at least 15 years or develop a health problem that would prevent you from having lung cancer treatment.  Fecal occult blood test (FOBT) of stool. / Every year beginning at age 67 and continuing until age 59. You may not have to do this test if you get a colonoscopy every 10 years.  Flexible sigmoidoscopy** or colonoscopy.** / Every 5 years for a flexible sigmoidoscopy or every 10 years for a colonoscopy beginning at age 27 and continuing until age  40.  Hepatitis C blood test.** / For all people born from [birth year] through 1965 and any individual with known risks for hepatitis C.  Skin self-exam. / Monthly.  Influenza vaccine. / Every  year.  Tetanus, diphtheria, and acellular pertussis (Tdap/Td) vaccine.** / Consult your health care provider. 1 dose of Td every 10 years.  Varicella vaccine.** / Consult your health care provider.  Zoster vaccine.** / 1 dose for adults aged 65 years or older.  Measles, mumps, rubella (MMR) vaccine.** / You need at least 1 dose of MMR if you were born in 1957 or later. You may also need a second dose.  Pneumococcal 13-valent conjugate (PCV13) vaccine.** / Consult your health care provider.  Pneumococcal polysaccharide (PPSV23) vaccine.** / 1 to 2 doses if you smoke cigarettes or if you have certain conditions.  Meningococcal vaccine.** / Consult your health care provider.  Hepatitis A vaccine.** / Consult your health care provider.  Hepatitis B vaccine.** / Consult your health care provider.  Haemophilus influenzae type b (Hib) vaccine.** / Consult your health care provider. Ages 57 and over  Blood pressure check.** / Every 1 to 2 years.  Lipid and cholesterol check.**/ Every 5 years beginning at age 74.  Lung cancer screening. / Every year if you are aged 19-80 years and have a 30-pack-year history of smoking and currently smoke or have quit within the past 15 years. Yearly screening is stopped once you have quit smoking for at least 15 years or develop a health problem that would prevent you from having lung cancer treatment.  Fecal occult blood test (FOBT) of stool. / Every year beginning at age 70 and continuing until age 48. You may not have to do this test if you get a colonoscopy every 10 years.  Flexible sigmoidoscopy** or colonoscopy.** / Every 5 years for a flexible sigmoidoscopy or every 10 years for a colonoscopy beginning at age 11 and continuing until age 16.  Hepatitis C blood  test.** / For all people born from [birth year] through 1965 and any individual with known risks for hepatitis C.  Abdominal aortic aneurysm (AAA) screening.** / A one-time screening for ages 73 to 70 years who are current or former smokers.  Skin self-exam. / Monthly.  Influenza vaccine. / Every year.  Tetanus, diphtheria, and acellular pertussis (Tdap/Td) vaccine.** / 1 dose of Td every 10 years.  Varicella vaccine.** / Consult your health care provider.  Zoster vaccine.** / 1 dose for adults aged 28 years or older.  Pneumococcal 13-valent conjugate (PCV13) vaccine.** / Consult your health care provider.  Pneumococcal polysaccharide (PPSV23) vaccine.** / 1 dose for all adults aged 66 years and older.  Meningococcal vaccine.** / Consult your health care provider.  Hepatitis A vaccine.** / Consult your health care provider.  Hepatitis B vaccine.** / Consult your health care provider.  Haemophilus influenzae type b (Hib) vaccine.** / Consult your health care provider. **Family history and personal history of risk and conditions may change your health care provider's recommendations. Document Released: 08/11/2001 Document Revised: 06/20/2013 Document Reviewed: 11/10/2010 [facility] Patient Information 2015 [place], [place]. This information is not intended to replace advice given to you by your health care provider. Make sure you discuss any questions you have with your health care provider.

## 2014-06-18 ENCOUNTER — Encounter: Payer: Self-pay | Admitting: Internal Medicine

## 2014-06-18 ENCOUNTER — Other Ambulatory Visit: Payer: Self-pay | Admitting: Internal Medicine

## 2014-06-18 DIAGNOSIS — Z125 Encounter for screening for malignant neoplasm of prostate: Secondary | ICD-10-CM | POA: Diagnosis not present

## 2014-06-18 DIAGNOSIS — E039 Hypothyroidism, unspecified: Secondary | ICD-10-CM | POA: Diagnosis not present

## 2014-06-19 LAB — PSA: PSA: 2.22 ng/mL (ref <=4.00)

## 2014-06-19 LAB — TSH: TSH: 1.123 u[IU]/mL (ref 0.350–4.500)

## 2014-07-05 ENCOUNTER — Other Ambulatory Visit: Payer: Self-pay | Admitting: *Deleted

## 2014-07-05 MED ORDER — IBUPROFEN 600 MG PO TABS: 600.0000 mg | ORAL_TABLET | Freq: Three times a day (TID) | ORAL | Status: DC | PRN

## 2014-08-27 ENCOUNTER — Ambulatory Visit (INDEPENDENT_AMBULATORY_CARE_PROVIDER_SITE_OTHER): Payer: Medicare Other | Admitting: Internal Medicine

## 2014-08-27 ENCOUNTER — Ambulatory Visit (INDEPENDENT_AMBULATORY_CARE_PROVIDER_SITE_OTHER)
Admission: RE | Admit: 2014-08-27 | Discharge: 2014-08-27 | Disposition: A | Discharge status: Home / Self Care | Payer: Medicare Other | Source: Ambulatory Visit | Attending: Internal Medicine | Admitting: Internal Medicine

## 2014-08-27 ENCOUNTER — Encounter: Payer: Self-pay | Admitting: Internal Medicine

## 2014-08-27 VITALS — BP 184/84 | HR 97 | Temp 98.4°F | Wt 215.2 lb

## 2014-08-27 DIAGNOSIS — M545 Low back pain, unspecified: Secondary | ICD-10-CM | POA: Insufficient documentation

## 2014-08-27 DIAGNOSIS — M160 Bilateral primary osteoarthritis of hip: Secondary | ICD-10-CM | POA: Diagnosis not present

## 2014-08-27 DIAGNOSIS — M5136 Other intervertebral disc degeneration, lumbar region: Secondary | ICD-10-CM | POA: Diagnosis not present

## 2014-08-27 NOTE — Progress Notes (Signed)
   Subjective:    HPI  C/o sacrum and LS spine area and in the L groin when he would need to get up x 3-4 weeks after he stumbled in his garage. There are some stiffness.  BP 130/85  BP Readings from Last 3 Encounters:  08/27/14 184/84  06/15/14 180/88  06/14/13 148/70   Wt Readings from Last 3 Encounters:  08/27/14 215 lb 4 oz (97.637 kg)  06/15/14 211 lb (95.709 kg)  06/14/13 202 lb (91.627 kg)      Review of Systems  Constitutional: Negative for appetite change, fatigue and unexpected weight change.  HENT: Negative for congestion, nosebleeds, sneezing, sore throat and trouble swallowing.   Eyes: Negative for itching and visual disturbance.  Respiratory: Negative for cough.   Cardiovascular: Negative for chest pain, palpitations and leg swelling.  Gastrointestinal: Negative for nausea, diarrhea, blood in stool and abdominal distention.  Genitourinary: Negative for frequency and hematuria.  Musculoskeletal: Negative for back pain, joint swelling, gait problem and neck pain.  Skin: Negative for rash.  Neurological: Negative for dizziness, tremors, speech difficulty and weakness.  Psychiatric/Behavioral: Negative for suicidal ideas, sleep disturbance, dysphoric mood and agitation. The patient is not nervous/anxious.        Objective:   Physical Exam  Constitutional: He is oriented to person, place, and time. He appears well-developed and well-nourished. No distress.  HENT:  Head: Normocephalic and atraumatic.  Right Ear: External ear normal.  Left Ear: External ear normal.  Nose: Nose normal.  Mouth/Throat: Oropharynx is clear and moist. No oropharyngeal exudate.  Eyes: Conjunctivae and EOM are normal. Pupils are equal, round, and reactive to light. Right eye exhibits no discharge. Left eye exhibits no discharge. No scleral icterus.  Neck: Normal range of motion. Neck supple. No JVD present. No tracheal deviation present. No thyromegaly present.  Cardiovascular: Normal  rate, regular rhythm, normal heart sounds and intact distal pulses.  Exam reveals no gallop and no friction rub.   No murmur heard. Pulmonary/Chest: Effort normal and breath sounds normal. No stridor. No respiratory distress. He has no wheezes. He has no rales. He exhibits no tenderness.  Abdominal: Soft. Bowel sounds are normal. He exhibits no distension and no mass. There is no tenderness. There is no rebound and no guarding.  Musculoskeletal: Normal range of motion. He exhibits no edema or tenderness.  Lymphadenopathy:    He has no cervical adenopathy.  Neurological: He is alert and oriented to person, place, and time. He has normal reflexes. No cranial nerve deficit. He exhibits normal muscle tone. Coordination normal.  Skin: Skin is warm and dry. No rash noted. He is not diaphoretic. No erythema. No pallor.  Psychiatric: He has a normal mood and affect. His behavior is normal. Judgment and thought content normal.  LS, hips NT  Lab Results  Component Value Date   WBC 7.4 06/15/2014          Assessment & Plan:

## 2014-08-27 NOTE — Progress Notes (Signed)
Pre visit review using our clinic review tool, if applicable. No additional management support is needed unless otherwise documented below in the visit note. 

## 2014-08-27 NOTE — Assessment & Plan Note (Addendum)
X ray Ibuprofen 1 tab twice a day x 2 weeks Stretch Call if not better

## 2014-08-27 NOTE — Patient Instructions (Signed)
Ibuprofen 1 tab twice a day x 2 weeks Stretch

## 2014-08-31 ENCOUNTER — Telehealth: Payer: Self-pay | Admitting: Internal Medicine

## 2014-08-31 DIAGNOSIS — M545 Low back pain: Secondary | ICD-10-CM

## 2014-08-31 DIAGNOSIS — R269 Unspecified abnormalities of gait and mobility: Secondary | ICD-10-CM

## 2014-08-31 DIAGNOSIS — M79605 Pain in left leg: Secondary | ICD-10-CM

## 2014-08-31 MED ORDER — PREDNISONE 10 MG PO TABS: ORAL_TABLET | ORAL | Status: DC

## 2014-08-31 NOTE — Telephone Encounter (Signed)
Pt's wife informed.  Prednisone phoned in to pharmacy.

## 2014-08-31 NOTE — Telephone Encounter (Signed)
Wife called in and said that pain has gotten worse, not pt is having trouble walking.  What does he need to do now?    Best number [phone]

## 2014-08-31 NOTE — Telephone Encounter (Signed)
I'll sch an MRI Take Prednisone - pls call in. D/c Ibuprofen OV next week Thx

## 2014-09-07 ENCOUNTER — Telehealth: Payer: Self-pay

## 2014-09-10 NOTE — Telephone Encounter (Signed)
LVM for the patient to call the practice to confirm date of flu vaccination or to call the office for an apt to take a flu shot. 

## 2014-09-16 ENCOUNTER — Inpatient Hospital Stay: Admission: RE | Admit: 2014-09-16 | Payer: Medicare Other | Source: Ambulatory Visit

## 2014-10-01 DIAGNOSIS — M9903 Segmental and somatic dysfunction of lumbar region: Secondary | ICD-10-CM | POA: Diagnosis not present

## 2014-10-01 DIAGNOSIS — M9905 Segmental and somatic dysfunction of pelvic region: Secondary | ICD-10-CM | POA: Diagnosis not present

## 2014-10-01 DIAGNOSIS — M5432 Sciatica, left side: Secondary | ICD-10-CM | POA: Diagnosis not present

## 2014-10-01 DIAGNOSIS — M5416 Radiculopathy, lumbar region: Secondary | ICD-10-CM | POA: Diagnosis not present

## 2014-10-01 DIAGNOSIS — M9902 Segmental and somatic dysfunction of thoracic region: Secondary | ICD-10-CM | POA: Diagnosis not present

## 2014-10-01 DIAGNOSIS — M545 Low back pain: Secondary | ICD-10-CM | POA: Diagnosis not present

## 2014-10-03 DIAGNOSIS — M5416 Radiculopathy, lumbar region: Secondary | ICD-10-CM | POA: Diagnosis not present

## 2014-10-03 DIAGNOSIS — M9903 Segmental and somatic dysfunction of lumbar region: Secondary | ICD-10-CM | POA: Diagnosis not present

## 2014-10-03 DIAGNOSIS — M9902 Segmental and somatic dysfunction of thoracic region: Secondary | ICD-10-CM | POA: Diagnosis not present

## 2014-10-03 DIAGNOSIS — M9905 Segmental and somatic dysfunction of pelvic region: Secondary | ICD-10-CM | POA: Diagnosis not present

## 2014-10-03 DIAGNOSIS — M545 Low back pain: Secondary | ICD-10-CM | POA: Diagnosis not present

## 2014-10-03 DIAGNOSIS — M5432 Sciatica, left side: Secondary | ICD-10-CM | POA: Diagnosis not present

## 2014-10-05 ENCOUNTER — Telehealth: Payer: Self-pay | Admitting: *Deleted

## 2014-10-05 MED ORDER — LOSARTAN POTASSIUM 100 MG PO TABS: 100.0000 mg | ORAL_TABLET | Freq: Every day | ORAL | Status: DC

## 2014-10-05 NOTE — Telephone Encounter (Signed)
Rec fax requesting to change Benicar 40 mg to a cheaper alternative like Losartan. Benicar is costing him $113/month. Please advise.

## 2014-10-05 NOTE — Telephone Encounter (Signed)
Losartan 100 mg qd #90 and 3 ref Thx

## 2014-10-05 NOTE — Telephone Encounter (Signed)
Pts wife informed.

## 2014-10-08 ENCOUNTER — Ambulatory Visit
Admission: RE | Admit: 2014-10-08 | Discharge: 2014-10-08 | Disposition: A | Discharge status: Home / Self Care | Payer: Medicare Other | Source: Ambulatory Visit | Attending: Internal Medicine | Admitting: Internal Medicine

## 2014-10-08 DIAGNOSIS — M79604 Pain in right leg: Secondary | ICD-10-CM

## 2014-10-08 DIAGNOSIS — R269 Unspecified abnormalities of gait and mobility: Secondary | ICD-10-CM

## 2014-10-08 DIAGNOSIS — M5137 Other intervertebral disc degeneration, lumbosacral region: Secondary | ICD-10-CM | POA: Diagnosis not present

## 2014-10-08 DIAGNOSIS — M79605 Pain in left leg: Secondary | ICD-10-CM

## 2014-10-08 DIAGNOSIS — M5126 Other intervertebral disc displacement, lumbar region: Secondary | ICD-10-CM | POA: Diagnosis not present

## 2014-10-08 DIAGNOSIS — M4806 Spinal stenosis, lumbar region: Secondary | ICD-10-CM | POA: Diagnosis not present

## 2014-10-08 DIAGNOSIS — M47817 Spondylosis without myelopathy or radiculopathy, lumbosacral region: Secondary | ICD-10-CM | POA: Diagnosis not present

## 2014-10-08 DIAGNOSIS — M545 Low back pain: Secondary | ICD-10-CM

## 2014-10-12 ENCOUNTER — Encounter: Payer: Self-pay | Admitting: Internal Medicine

## 2014-10-12 ENCOUNTER — Ambulatory Visit (INDEPENDENT_AMBULATORY_CARE_PROVIDER_SITE_OTHER): Payer: Medicare Other | Admitting: Internal Medicine

## 2014-10-12 VITALS — BP 148/88 | HR 95 | Wt 216.0 lb

## 2014-10-12 DIAGNOSIS — I10 Essential (primary) hypertension: Secondary | ICD-10-CM

## 2014-10-12 DIAGNOSIS — M545 Low back pain, unspecified: Secondary | ICD-10-CM

## 2014-10-12 MED ORDER — MELOXICAM 15 MG PO TABS
15.0000 mg | ORAL_TABLET | Freq: Every day | ORAL | Status: DC
Start: 2014-10-12 — End: 2014-10-16

## 2014-10-12 MED ORDER — VITAMIN D 1000 UNITS PO TABS: 1000.0000 [IU] | ORAL_TABLET | Freq: Every day | ORAL | Status: AC

## 2014-10-12 MED ORDER — TRAMADOL HCL 50 MG PO TABS: 50.0000 mg | ORAL_TABLET | Freq: Two times a day (BID) | ORAL | Status: DC | PRN

## 2014-10-12 NOTE — Assessment & Plan Note (Addendum)
Spinal stenosis. Doing better Pain clinic for injections suggested PT suggested Meloxicam Tramadol prn  Potential benefits of a long term opioids use as well as potential risks (i.e. addiction risk, apnea etc) and complications (i.e. Somnolence, constipation and others) were explained to the patient and were aknowledged.    IMPRESSION: Multilevel degenerative change as above.  Multilevel spinal stenosis most severe at L2-3 due to diffuse disc protrusion and facet hypertrophy. Moderate to severe spinal stenosis at L2-3.  Mild spinal stenosis at L3-4  Grade 1 slip L4-5 with moderate spinal stenosis.   Electronically Signed  By: Franchot Gallo M.D.  On: 10/08/2014 20:24

## 2014-10-12 NOTE — Assessment & Plan Note (Signed)
Losartan 

## 2014-10-12 NOTE — Progress Notes (Signed)
Pre visit review using our clinic review tool, if applicable. No additional management support is needed unless otherwise documented below in the visit note. 

## 2014-10-12 NOTE — Progress Notes (Signed)
   Subjective:    HPI  F/u sacrum and LS spine area irrad to B legs and in the L groin when he would need to get up x 3-4 weeks after he stumbled in his garage. There was some stiffness. Pain in the back and B LEs is 60% better  BP 130/85  BP Readings from Last 3 Encounters:  10/12/14 168/98  08/27/14 184/84  06/15/14 180/88   Wt Readings from Last 3 Encounters:  10/12/14 216 lb (97.977 kg)  08/27/14 215 lb 4 oz (97.637 kg)  06/15/14 211 lb (95.709 kg)      Review of Systems  Constitutional: Negative for appetite change, fatigue and unexpected weight change.  HENT: Negative for congestion, nosebleeds, sneezing, sore throat and trouble swallowing.   Eyes: Negative for itching and visual disturbance.  Respiratory: Negative for cough.   Cardiovascular: Negative for chest pain, palpitations and leg swelling.  Gastrointestinal: Negative for nausea, diarrhea, blood in stool and abdominal distention.  Genitourinary: Negative for frequency and hematuria.  Musculoskeletal: Negative for back pain, joint swelling, gait problem and neck pain.  Skin: Negative for rash.  Neurological: Negative for dizziness, tremors, speech difficulty and weakness.  Psychiatric/Behavioral: Negative for suicidal ideas, sleep disturbance, dysphoric mood and agitation. The patient is not nervous/anxious.        Objective:   Physical Exam  Constitutional: He is oriented to person, place, and time. He appears well-developed and well-nourished. No distress.  HENT:  Head: Normocephalic and atraumatic.  Right Ear: External ear normal.  Left Ear: External ear normal.  Nose: Nose normal.  Mouth/Throat: Oropharynx is clear and moist. No oropharyngeal exudate.  Eyes: Conjunctivae and EOM are normal. Pupils are equal, round, and reactive to light. Right eye exhibits no discharge. Left eye exhibits no discharge. No scleral icterus.  Neck: Normal range of motion. Neck supple. No JVD present. No tracheal deviation  present. No thyromegaly present.  Cardiovascular: Normal rate, regular rhythm, normal heart sounds and intact distal pulses.  Exam reveals no gallop and no friction rub.   No murmur heard. Pulmonary/Chest: Effort normal and breath sounds normal. No stridor. No respiratory distress. He has no wheezes. He has no rales. He exhibits no tenderness.  Abdominal: Soft. Bowel sounds are normal. He exhibits no distension and no mass. There is no tenderness. There is no rebound and no guarding.  Musculoskeletal: Normal range of motion. He exhibits no edema or tenderness.  Lymphadenopathy:    He has no cervical adenopathy.  Neurological: He is alert and oriented to person, place, and time. He has normal reflexes. No cranial nerve deficit. He exhibits normal muscle tone. Coordination normal.  Skin: Skin is warm and dry. No rash noted. He is not diaphoretic. No erythema. No pallor.  Psychiatric: He has a normal mood and affect. His behavior is normal. Judgment and thought content normal.  LS, hips NT  Lab Results  Component Value Date   WBC 7.4 06/15/2014          Assessment & Plan:

## 2014-10-12 NOTE — Patient Instructions (Signed)
2

## 2014-10-15 ENCOUNTER — Telehealth: Payer: Self-pay | Admitting: Internal Medicine

## 2014-10-15 NOTE — Telephone Encounter (Signed)
Patient wife is calling to ask if traMADol (ULTRAM) 50 MG tablet [[id number]]  meloxicam (MOBIC) 15 MG tablet [[id number]] RX can be changed. Patient took one on Friday, then the other on Saturday. Both days the patient experienced severe itching. They believe he is allergic to both meds.

## 2014-10-16 MED ORDER — HYDROCODONE-ACETAMINOPHEN 5-325 MG PO TABS: 1.0000 | ORAL_TABLET | Freq: Two times a day (BID) | ORAL | Status: DC | PRN

## 2014-10-16 NOTE — Telephone Encounter (Signed)
Wife return call back she stated both medication cause itching...Patrick Morgan

## 2014-10-16 NOTE — Telephone Encounter (Signed)
It is unlikely. It has to be one or the other. Stop both meds. Try Norco - 1/2 or 1 bid prn. Pick up Rx Thx

## 2014-10-16 NOTE — Telephone Encounter (Signed)
Which one? Thx

## 2014-10-16 NOTE — Telephone Encounter (Signed)
OK to fill both Rx's with additional refills x3 Thank you!

## 2014-10-16 NOTE — Telephone Encounter (Signed)
Called pt/wife no answer LMOM RTC.../lmb 

## 2014-10-16 NOTE — Telephone Encounter (Signed)
Pt is needing refills wife is wanting md to change medication. She stated that pt took med on Friday & Sat after taking he starting having some severe itching. He thinks he is allergic to med. Wanting md to rx something else...Johny Chess

## 2014-10-17 ENCOUNTER — Telehealth: Payer: Self-pay | Admitting: *Deleted

## 2014-10-17 MED ORDER — HYDROCODONE-ACETAMINOPHEN 5-325 MG PO TABS: 1.0000 | ORAL_TABLET | Freq: Two times a day (BID) | ORAL | Status: DC | PRN

## 2014-10-17 NOTE — Telephone Encounter (Signed)
[place] Night - Client TELEPHONE ADVICE RECORD [facility] Patient Name: Patrick Morgan Gender: Male DOB: 07/23/1945 Age: 69 Y 69 M 6 D Return Phone Number: [phone] (Primary) Address: City/State/Zip: [place] [address] Client  [facility] - Client Client Site [place] - Night Physician Plotnikov, Alex Contact Type Call Call Type Triage / Clinical Caller Name Hassan Rowan Relationship To Patient Spouse Return Phone Number [phone] (Primary) Chief Complaint Allergic reaction (unknown symptoms) Initial Comment Caller states her husband had a severe reaction to medication . he didn't sleep at all last night because his feet were itching PreDisposition Call Doctor Nurse Assessment Nurse: Vevelyn Royals, RN, Verdis Frederickson Date/Time Eilene Ghazi Time): 10/14/2014 9:38:32 AM Confirm and document reason for call. If symptomatic, describe symptoms. ---Caller states her husband had a severe reaction to medication . He didn't sleep at all last night because his feet were itching. Medication was Tramadol and Mobic for arthritis. Has the patient traveled out of the country within the last 30 days? ---No Does the patient require triage? ---Yes Related visit to physician within the last 2 weeks? ---Yes Seen on Friday; MRI on 10/08/14. Does the PT have any chronic conditions? (i.e. diabetes, asthma, etc.) ---Yes List chronic conditions. ---arthritis Guidelines Guideline Title Affirmed Question Affirmed Notes Nurse Date/Time (Eastern Time) Itching - Localized [1] MODERATESEVERE local itching (i.e., interferes with work, school, activities) AND [2] not improved after 24 hours of hydrocortisone cream Patient states he tried Cortisone ointment a couple times and took Benadryl and continued to itch so bad he could not sleep. Tried icing and cold water. Vevelyn Royals, RN, Verdis Frederickson 10/14/2014 9:44:16 AM Disp. Time Eilene Ghazi Time) Disposition Final  User 10/14/2014 9:49:45 AM See Physician within 24 Hours Yes Vevelyn Royals, RN, Verdis Frederickson Disposition Overriden: See PCP When Office is Open (within 3 days) PLEASE NOTE: All timestamps contained within this report are represented as [place] Standard Time. CONFIDENTIALTY NOTICE: This fax transmission is intended only for the addressee. It contains information that is legally privileged, confidential or otherwise protected from use or disclosure. If you are not the intended recipient, you are strictly prohibited from reviewing, disclosing, copying using or disseminating any of this information or taking any action in reliance on or regarding this information. If you have received this fax in error, please notify us immediately by telephone so that we can arrange for its return to [place]. Phone: [phone], Toll-Free: [phone], Fax: [phone] Page: 2 of 2 Call Id: [id number] Egan Reason: Patient's symptoms need a higher level of care Caller Understands: Yes Disagree/Comply: Comply Care Advice Given Per Guideline * IF OFFICE WILL BE OPEN: You need to be examined within the next 24 hours. Call your doctor when the office opens, and make an appointment. CLEANSING: Wash the infected area with warm water and an antibacterial soap three times a day. CALL BACK IF: * Fever occurs * You become worse. CARE ADVICE given per Itching, Localized (Adult) guideline. ANTIBIOTIC OINTMENT: Apply antibiotic ointment (OTC) to the infected area 3 times per day. After Care Instructions Given Call Event Type User Date / Time Description Referrals REFERRED TO PCP OFFICE

## 2014-10-17 NOTE — Telephone Encounter (Signed)
Called pt/wife no answer LMOM with md response. Had to reprint script md printed from home which the first rx did not print. Place in cabinet for pick-up...Patrick Morgan

## 2014-12-24 ENCOUNTER — Other Ambulatory Visit: Payer: Self-pay

## 2015-03-25 DIAGNOSIS — Z23 Encounter for immunization: Secondary | ICD-10-CM | POA: Diagnosis not present

## 2015-05-01 ENCOUNTER — Other Ambulatory Visit: Payer: Self-pay | Admitting: Internal Medicine

## 2015-05-03 ENCOUNTER — Other Ambulatory Visit: Payer: Self-pay | Admitting: Internal Medicine

## 2015-05-06 NOTE — Telephone Encounter (Signed)
Please advise, thanks.

## 2015-05-08 NOTE — Telephone Encounter (Signed)
Called in to [facility]

## 2015-05-08 NOTE — Telephone Encounter (Signed)
Done

## 2015-06-08 ENCOUNTER — Emergency Department (HOSPITAL_COMMUNITY): Payer: Medicare Other

## 2015-06-08 ENCOUNTER — Emergency Department (HOSPITAL_COMMUNITY)
Admission: EM | Admit: 2015-06-08 | Discharge: 2015-06-08 | Disposition: A | Discharge status: Home / Self Care | Payer: Medicare Other | Attending: Emergency Medicine | Admitting: Emergency Medicine

## 2015-06-08 ENCOUNTER — Encounter (HOSPITAL_COMMUNITY): Payer: Self-pay | Admitting: Emergency Medicine

## 2015-06-08 DIAGNOSIS — I493 Ventricular premature depolarization: Secondary | ICD-10-CM | POA: Diagnosis not present

## 2015-06-08 DIAGNOSIS — I10 Essential (primary) hypertension: Secondary | ICD-10-CM | POA: Insufficient documentation

## 2015-06-08 DIAGNOSIS — Z7982 Long term (current) use of aspirin: Secondary | ICD-10-CM | POA: Insufficient documentation

## 2015-06-08 DIAGNOSIS — R002 Palpitations: Secondary | ICD-10-CM | POA: Diagnosis present

## 2015-06-08 DIAGNOSIS — E119 Type 2 diabetes mellitus without complications: Secondary | ICD-10-CM | POA: Insufficient documentation

## 2015-06-08 DIAGNOSIS — Z87891 Personal history of nicotine dependence: Secondary | ICD-10-CM | POA: Insufficient documentation

## 2015-06-08 DIAGNOSIS — Z79899 Other long term (current) drug therapy: Secondary | ICD-10-CM | POA: Diagnosis not present

## 2015-06-08 HISTORY — DX: Dizziness and giddiness: R42

## 2015-06-08 LAB — BASIC METABOLIC PANEL
Anion gap: 10 (ref 5–15)
BUN: 18 mg/dL (ref 6–20)
CHLORIDE: 106 mmol/L (ref 101–111)
CO2: 21 mmol/L — AB (ref 22–32)
CREATININE: 1.01 mg/dL (ref 0.61–1.24)
Calcium: 9.4 mg/dL (ref 8.9–10.3)
GFR calc Af Amer: >60 mL/min (ref >60)
GFR calc non Af Amer: >60 mL/min (ref >60)
Glucose, Bld: 162 mg/dL — ABNORMAL HIGH (ref 65–99)
Potassium: 3.8 mmol/L (ref 3.5–5.1)
Sodium: 137 mmol/L (ref 135–145)

## 2015-06-08 LAB — CBC
HEMATOCRIT: 44 % (ref 39.0–52.0)
Hemoglobin: 15.4 g/dL (ref 13.0–17.0)
MCH: 35 pg — ABNORMAL HIGH (ref 26.0–34.0)
MCHC: 35 g/dL (ref 30.0–36.0)
MCV: 100 fL (ref 78.0–100.0)
Platelets: 200 10*3/uL (ref 150–400)
RBC: 4.4 MIL/uL (ref 4.22–5.81)
RDW: 12.2 % (ref 11.5–15.5)
WBC: 8 10*3/uL (ref 4.0–10.5)

## 2015-06-08 LAB — I-STAT TROPONIN, ED: Troponin i, poc: 0.01 ng/mL (ref 0.00–0.08)

## 2015-06-08 LAB — D-DIMER, QUANTITATIVE (NOT AT ARMC): D DIMER QUANT: 0.3 ug{FEU}/mL (ref 0.00–0.50)

## 2015-06-08 MED ORDER — METFORMIN HCL 1000 MG PO TABS: 1000.0000 mg | ORAL_TABLET | Freq: Two times a day (BID) | ORAL | Status: DC

## 2015-06-08 NOTE — ED Provider Notes (Signed)
CSN: [id number]     Arrival date & time 06/08/15  [id number] History   First MD Initiated Contact with Patient 06/08/15 [phone]     Chief Complaint  Patient presents with  . Palpitations     (Consider location/radiation/quality/duration/timing/severity/associated sxs/prior Treatment) HPI 69 year old male comes today stating he has had palpitations for the past 2 weeks. He describes this as feeling like his heart is beating irregularly within segments radiating into his chest. He has some dyspnea associated with this. He denies any pain associated with this. He states it has not woken him from sleep during the night. He has noticed it at different times during the day. He is unable to tell me anything that seems to bring it on or is it off. He states that he has had less energy than usual. He has had a decreased appetite but has been taking fluids without difficulty. Although he has not been eating as well as usual he has had some unintentional weight gain of approximately 10 pounds over the past 6 months he lives at home with his wife and is retired. He works on cars for enjoyment in his spare time. Past Medical History  Diagnosis Date  . Hypertension   . Vertigo    History reviewed. No pertinent past surgical history. Family History  Problem Relation Age of Onset  . Alzheimer's disease Mother   . Cancer Father 33    bladder ca  . Diabetes Father    Social History  Substance Use Topics  . Smoking status: Former Research scientist (life sciences)  . Smokeless tobacco: None  . Alcohol Use: No    Review of Systems  All other systems reviewed and are negative.     Allergies  Review of patient's allergies indicates no known allergies.  Home Medications   Prior to Admission medications   Medication Sig Start Date End Date Taking? Authorizing Provider  aspirin 81 MG tablet Take 81 mg by mouth daily.    Historical Provider, MD  cholecalciferol (VITAMIN D) 1000 UNITS tablet Take 1 tablet (1,000 Units total) by  mouth daily. 10/12/14 10/12/15  Aleksei Plotnikov V, MD  HYDROcodone-acetaminophen (NORCO/VICODIN) 5-325 MG per tablet Take 1 tablet by mouth 2 (two) times daily as needed for moderate pain or severe pain. 10/17/14   Aleksei Plotnikov V, MD  losartan (COZAAR) 100 MG tablet Take 1 tablet (100 mg total) by mouth daily. 10/05/14   Aleksei Plotnikov V, MD  traMADol (ULTRAM) 50 MG tablet take 1-2 tablets by mouth twice a day if needed 05/08/15   Cassandria Anger, MD  traMADol (ULTRAM) 50 MG tablet take 1-2 tablets by mouth twice a day if needed 05/08/15   Aleksei Plotnikov V, MD   BP 184/77 mmHg  Pulse 109  Resp 20  Ht 5\' 10"  (1.778 m)  Wt 95.255 kg  BMI 30.13 kg/m2  SpO2 100% Physical Exam  Constitutional: He is oriented to person, place, and time. He appears well-developed and well-nourished.  HENT:  Head: Normocephalic and atraumatic.  Right Ear: External ear normal.  Left Ear: External ear normal.  Nose: Nose normal.  Mouth/Throat: Oropharynx is clear and moist.  Eyes: Conjunctivae and EOM are normal. Pupils are equal, round, and reactive to light.  Neck: Normal range of motion. Neck supple.  Cardiovascular: Normal heart sounds and intact distal pulses.  An irregular rhythm present. Tachycardia present.   Pulmonary/Chest: Effort normal and breath sounds normal. No respiratory distress. He has no wheezes. He has no rales. He exhibits no  tenderness.  Abdominal: Soft. Bowel sounds are normal. He exhibits no distension and no mass. There is no tenderness. There is no guarding.  Musculoskeletal: Normal range of motion. He exhibits no edema or tenderness.  Neurological: He is alert and oriented to person, place, and time. He has normal reflexes. He exhibits normal muscle tone. Coordination normal.  Skin: Skin is warm and dry.  Psychiatric: He has a normal mood and affect. His behavior is normal. Judgment and thought content normal.  Nursing note and vitals reviewed.   ED Course  Procedures  (including critical care time) Labs Review Labs Reviewed  BASIC METABOLIC PANEL - Abnormal; Notable for the following:    CO2 21 (*)    Glucose, Bld 162 (*)    All other components within normal limits  CBC - Abnormal; Notable for the following:    MCH 35.0 (*)    All other components within normal limits  D-DIMER, QUANTITATIVE (NOT AT [facility])  I-STAT TROPOININ, ED    Imaging Review No results found. I have personally reviewed and evaluated these images and lab results as part of my medical decision-making.   EKG Interpretation   Date/Time:  Saturday June 08 2015 09:11:56 EST Ventricular Rate:  108 PR Interval:  153 QRS Duration: 90 QT Interval:  324 QTC Calculation: 434 R Axis:   107 Text Interpretation:  Sinus tachycardia Multiple ventricular premature  complexes Right axis deviation Confirmed by Everlie Eble MD, Andee Poles [id number]) on  06/08/2015 9:29:28 AM      MDM   Final diagnoses:  New onset type 2 diabetes mellitus (HCC)  Palpitations  PVC (premature ventricular contraction)   69 year old man comes in today with palpitations and some generalized weakness over the past 2 weeks. EKG here shows a sinus tachycardia with multiple PVCs. No underlying lying etiology is found. His last lites are normal. He complains of chest pain and troponin was checked and is normal. He has a normal d-dimer. He has no evidence of blood clots or pulmonary embolism on exam or history. However, his blood sugar is elevated at 162 and could be producing some of the symptoms. Plan to begin metformin, discussed lifestyle changes, and prepare for close follow-up with his primary care physician.   Pattricia Boss, MD 06/09/15 [phone]

## 2015-06-08 NOTE — Discharge Instructions (Signed)

## 2015-06-08 NOTE — ED Notes (Signed)
Pt c/o palpitations, starts gradually, has been going on for approx 2 weeks. Feels that heart rate is fast. Denies chest pain.

## 2015-06-11 ENCOUNTER — Encounter: Payer: Self-pay | Admitting: Internal Medicine

## 2015-06-11 ENCOUNTER — Ambulatory Visit (INDEPENDENT_AMBULATORY_CARE_PROVIDER_SITE_OTHER): Payer: Medicare Other | Admitting: Internal Medicine

## 2015-06-11 VITALS — BP 150/58 | HR 88 | Wt 207.0 lb

## 2015-06-11 DIAGNOSIS — E785 Hyperlipidemia, unspecified: Secondary | ICD-10-CM | POA: Diagnosis not present

## 2015-06-11 DIAGNOSIS — R739 Hyperglycemia, unspecified: Secondary | ICD-10-CM | POA: Diagnosis not present

## 2015-06-11 DIAGNOSIS — R011 Cardiac murmur, unspecified: Secondary | ICD-10-CM | POA: Diagnosis not present

## 2015-06-11 DIAGNOSIS — R002 Palpitations: Secondary | ICD-10-CM | POA: Diagnosis not present

## 2015-06-11 LAB — GLUCOSE, POCT (MANUAL RESULT ENTRY): POC Glucose: 88 mg/dl (ref 70–99)

## 2015-06-11 MED ORDER — AMLODIPINE BESYLATE 5 MG PO TABS: 5.0000 mg | ORAL_TABLET | Freq: Every day | ORAL | Status: DC

## 2015-06-11 MED ORDER — METFORMIN HCL 1000 MG PO TABS: 1000.0000 mg | ORAL_TABLET | Freq: Every day | ORAL | Status: DC

## 2015-06-11 NOTE — Progress Notes (Signed)
Subjective:  Patient ID: Patrick Morgan, male    DOB: 08/26/1945  Age: 69 y.o. MRN: [id number]  CC: No chief complaint on file.   HPI Patrick Morgan presents for fatigue x 1 month, palpitations, weak spells x 1 month. Pt went to ER on 12/10 - started Metformin 500 mg bid for CBG 180 - c/o diarrhea  Outpatient Prescriptions Prior to Visit  Medication Sig Dispense Refill  . aspirin 81 MG tablet Take 81 mg by mouth daily.    . cholecalciferol (VITAMIN D) 1000 UNITS tablet Take 1 tablet (1,000 Units total) by mouth daily. 100 tablet 3  . HYDROcodone-acetaminophen (NORCO/VICODIN) 5-325 MG per tablet Take 1 tablet by mouth 2 (two) times daily as needed for moderate pain or severe pain. 60 tablet 0  . ibuprofen (ADVIL,MOTRIN) 600 MG tablet Take 1 tablet by mouth as needed. For moderate pain  0  . losartan (COZAAR) 100 MG tablet Take 1 tablet (100 mg total) by mouth daily. 90 tablet 3  . metFORMIN (GLUCOPHAGE) 1000 MG tablet Take 1 tablet (1,000 mg total) by mouth 2 (two) times daily. 30 tablet 0  . Multiple Vitamin (MULTIVITAMIN) tablet Take 1 tablet by mouth daily.    . traMADol (ULTRAM) 50 MG tablet take 1-2 tablets by mouth twice a day if needed 120 tablet 2  . traMADol (ULTRAM) 50 MG tablet take 1-2 tablets by mouth twice a day if needed 120 tablet 2   No facility-administered medications prior to visit.    ROS Review of Systems  Constitutional: Positive for fatigue. Negative for appetite change and unexpected weight change.  HENT: Negative for congestion, nosebleeds, sneezing, sore throat and trouble swallowing.   Eyes: Negative for itching and visual disturbance.  Respiratory: Negative for cough.   Cardiovascular: Negative for chest pain, palpitations and leg swelling.  Gastrointestinal: Positive for diarrhea. Negative for nausea, blood in stool and abdominal distention.  Genitourinary: Negative for frequency and hematuria.  Musculoskeletal: Positive for arthralgias. Negative for  back pain, joint swelling, gait problem and neck pain.  Skin: Negative for rash.  Neurological: Negative for dizziness, tremors, speech difficulty and weakness.  Psychiatric/Behavioral: Negative for suicidal ideas, sleep disturbance, dysphoric mood and agitation. The patient is not nervous/anxious.     Objective:  BP 150/58 mmHg  Pulse 88  Wt 207 lb (93.895 kg)  SpO2 97%  BP Readings from Last 3 Encounters:  06/11/15 150/58  06/08/15 149/90  10/12/14 148/88    Wt Readings from Last 3 Encounters:  06/11/15 207 lb (93.895 kg)  06/08/15 210 lb (95.255 kg)  10/12/14 216 lb (97.977 kg)    Physical Exam  Constitutional: He is oriented to person, place, and time. He appears well-developed. No distress.  NAD  HENT:  Mouth/Throat: Oropharynx is clear and moist.  Eyes: Conjunctivae are normal. Pupils are equal, round, and reactive to light.  Neck: Normal range of motion. No JVD present. No thyromegaly present.  Cardiovascular: Normal rate, normal heart sounds and intact distal pulses.  Exam reveals no gallop and no friction rub.   No murmur heard. Pulmonary/Chest: Effort normal and breath sounds normal. No respiratory distress. He has no wheezes. He has no rales. He exhibits no tenderness.  Abdominal: Soft. Bowel sounds are normal. He exhibits no distension and no mass. There is no tenderness. There is no rebound and no guarding.  Musculoskeletal: Normal range of motion. He exhibits no edema or tenderness.  Lymphadenopathy:    He has no cervical adenopathy.  Neurological: He is alert and oriented to person, place, and time. He has normal reflexes. No cranial nerve deficit. He exhibits normal muscle tone. He displays a negative Romberg sign. Coordination and gait normal.  Skin: Skin is warm and dry. No rash noted.  Psychiatric: He has a normal mood and affect. His behavior is normal. Judgment and thought content normal.    Lab Results  Component Value Date   WBC 8.0 06/08/2015    HGB 15.4 06/08/2015   HCT 44.0 06/08/2015   PLT 200 06/08/2015   GLUCOSE 162* 06/08/2015   CHOL 188 06/15/2014   TRIG 70.0 06/15/2014   HDL 39.50 06/15/2014   LDLCALC 135* 06/15/2014   ALT 25 06/15/2014   AST 25 06/15/2014   NA 137 06/08/2015   K 3.8 06/08/2015   CL 106 06/08/2015   CREATININE 1.01 06/08/2015   BUN 18 06/08/2015   CO2 21* 06/08/2015   TSH 1.123 06/18/2014   PSA 2.22 06/18/2014    Dg Chest 2 View  06/08/2015  ADDENDUM REPORT: 06/08/2015 10:30 ADDENDUM: Impression should read:  No edema or consolidation. Electronically Signed   By: Lowella Grip III M.D.   On: 06/08/2015 10:30  06/08/2015  CLINICAL DATA:  Cardiac palpitations for 2 weeks.  Hypertension. EXAM: CHEST  2 VIEW COMPARISON:  None. FINDINGS: Lungs are clear. Heart size and pulmonary vascularity are normal. No adenopathy. There is degenerative change thoracic spine. IMPRESSION: Edema or consolidation. Electronically Signed: By: Lowella Grip III M.D. On: 06/08/2015 10:26    Assessment & Plan:   There are no diagnoses linked to this encounter. I am having Mr. Adamy maintain his aspirin, losartan, cholecalciferol, HYDROcodone-acetaminophen, traMADol, traMADol, ibuprofen, multivitamin, and metFORMIN.  No orders of the defined types were placed in this encounter.     Follow-up: No Follow-up on file.  Walker Kehr, MD

## 2015-06-11 NOTE — Progress Notes (Signed)
Pre visit review using our clinic review tool, if applicable. No additional management support is needed unless otherwise documented below in the visit note. 

## 2015-06-11 NOTE — Patient Instructions (Signed)
Add Amlodipine Continue Losartan

## 2015-06-12 ENCOUNTER — Other Ambulatory Visit (INDEPENDENT_AMBULATORY_CARE_PROVIDER_SITE_OTHER): Payer: Medicare Other

## 2015-06-12 DIAGNOSIS — R002 Palpitations: Secondary | ICD-10-CM

## 2015-06-12 DIAGNOSIS — E785 Hyperlipidemia, unspecified: Secondary | ICD-10-CM

## 2015-06-12 DIAGNOSIS — R739 Hyperglycemia, unspecified: Secondary | ICD-10-CM

## 2015-06-12 DIAGNOSIS — R7309 Other abnormal glucose: Secondary | ICD-10-CM | POA: Diagnosis not present

## 2015-06-12 LAB — LIPID PANEL
CHOL/HDL RATIO: 3
Cholesterol: 149 mg/dL (ref 0–200)
HDL: 43.5 mg/dL (ref >39.00)
LDL CALC: 89 mg/dL (ref 0–99)
NonHDL: 105.66
TRIGLYCERIDES: 84 mg/dL (ref 0.0–149.0)
VLDL: 16.8 mg/dL (ref 0.0–40.0)

## 2015-06-12 LAB — URINALYSIS
Bilirubin Urine: NEGATIVE
HGB URINE DIPSTICK: NEGATIVE
Ketones, ur: NEGATIVE
Leukocytes, UA: NEGATIVE
NITRITE: NEGATIVE
Specific Gravity, Urine: >=1.03 — AB (ref 1.000–1.030)
TOTAL PROTEIN, URINE-UPE24: NEGATIVE
Urine Glucose: NEGATIVE
Urobilinogen, UA: 0.2 (ref 0.0–1.0)
pH: 6 (ref 5.0–8.0)

## 2015-06-12 LAB — BASIC METABOLIC PANEL
BUN: 19 mg/dL (ref 6–23)
CALCIUM: 9.8 mg/dL (ref 8.4–10.5)
CO2: 26 meq/L (ref 19–32)
CREATININE: 0.92 mg/dL (ref 0.40–1.50)
Chloride: 104 mEq/L (ref 96–112)
GFR: 104.83 mL/min (ref >60.00)
GLUCOSE: 103 mg/dL — AB (ref 70–99)
Potassium: 4 mEq/L (ref 3.5–5.1)
Sodium: 139 mEq/L (ref 135–145)

## 2015-06-12 LAB — CBC WITH DIFFERENTIAL/PLATELET
BASOS ABS: 0 10*3/uL (ref 0.0–0.1)
Basophils Relative: 0.4 % (ref 0.0–3.0)
EOS ABS: 0.1 10*3/uL (ref 0.0–0.7)
Eosinophils Relative: 1.3 % (ref 0.0–5.0)
HCT: 44.4 % (ref 39.0–52.0)
Hemoglobin: 15.1 g/dL (ref 13.0–17.0)
LYMPHS ABS: 1.4 10*3/uL (ref 0.7–4.0)
Lymphocytes Relative: 16.6 % (ref 12.0–46.0)
MCHC: 34 g/dL (ref 30.0–36.0)
MCV: 100.8 fl — ABNORMAL HIGH (ref 78.0–100.0)
MONO ABS: 0.7 10*3/uL (ref 0.1–1.0)
Monocytes Relative: 8 % (ref 3.0–12.0)
NEUTROS ABS: 6.3 10*3/uL (ref 1.4–7.7)
NEUTROS PCT: 73.7 % (ref 43.0–77.0)
PLATELETS: 235 10*3/uL (ref 150.0–400.0)
RBC: 4.41 Mil/uL (ref 4.22–5.81)
RDW: 12.8 % (ref 11.5–15.5)
WBC: 8.5 10*3/uL (ref 4.0–10.5)

## 2015-06-12 LAB — HEPATIC FUNCTION PANEL
ALBUMIN: 4.5 g/dL (ref 3.5–5.2)
ALK PHOS: 67 U/L (ref 39–117)
ALT: 52 U/L (ref 0–53)
AST: 32 U/L (ref 0–37)
BILIRUBIN DIRECT: 0.2 mg/dL (ref 0.0–0.3)
BILIRUBIN TOTAL: 0.7 mg/dL (ref 0.2–1.2)
Total Protein: 7.6 g/dL (ref 6.0–8.3)

## 2015-06-12 LAB — SEDIMENTATION RATE: Sed Rate: 12 mm/hr (ref 0–22)

## 2015-06-12 LAB — TSH: TSH: 1.22 u[IU]/mL (ref 0.35–4.50)

## 2015-06-12 LAB — HEMOGLOBIN A1C: Hgb A1c MFr Bld: 4.9 % (ref 4.6–6.5)

## 2015-06-13 ENCOUNTER — Telehealth: Payer: Self-pay

## 2015-06-13 LAB — INSULIN, FASTING: Insulin fasting, serum: 11.7 u[IU]/mL (ref 2.0–19.6)

## 2015-06-13 NOTE — Telephone Encounter (Signed)
Call to discuss AWV and wife stated that his apt has changed to the 2nd week in Jan, but seeing cardiology at the end of Dec. Will hold on AWV until after current apts and will schedule to complete if not completed at CPE

## 2015-06-14 ENCOUNTER — Ambulatory Visit: Payer: Medicare Other | Admitting: Internal Medicine

## 2015-06-18 ENCOUNTER — Ambulatory Visit: Payer: Medicare Other | Admitting: Internal Medicine

## 2015-06-19 ENCOUNTER — Encounter: Payer: Medicare Other | Admitting: Internal Medicine

## 2015-06-27 ENCOUNTER — Other Ambulatory Visit: Payer: Self-pay

## 2015-06-27 ENCOUNTER — Encounter: Payer: Self-pay | Admitting: *Deleted

## 2015-06-27 ENCOUNTER — Ambulatory Visit (HOSPITAL_COMMUNITY): Payer: Medicare Other | Attending: Cardiovascular Disease

## 2015-06-27 ENCOUNTER — Other Ambulatory Visit: Payer: Self-pay | Admitting: Internal Medicine

## 2015-06-27 DIAGNOSIS — R011 Cardiac murmur, unspecified: Secondary | ICD-10-CM | POA: Diagnosis present

## 2015-06-27 DIAGNOSIS — R002 Palpitations: Secondary | ICD-10-CM | POA: Insufficient documentation

## 2015-06-28 ENCOUNTER — Encounter: Payer: Self-pay | Admitting: Physician Assistant

## 2015-06-28 ENCOUNTER — Ambulatory Visit (INDEPENDENT_AMBULATORY_CARE_PROVIDER_SITE_OTHER): Payer: Medicare Other | Admitting: Physician Assistant

## 2015-06-28 ENCOUNTER — Ambulatory Visit (INDEPENDENT_AMBULATORY_CARE_PROVIDER_SITE_OTHER): Payer: Medicare Other

## 2015-06-28 VITALS — BP 156/68 | HR 102 | Ht 70.5 in | Wt 205.4 lb

## 2015-06-28 DIAGNOSIS — I493 Ventricular premature depolarization: Secondary | ICD-10-CM

## 2015-06-28 DIAGNOSIS — R002 Palpitations: Secondary | ICD-10-CM

## 2015-06-28 DIAGNOSIS — I10 Essential (primary) hypertension: Secondary | ICD-10-CM

## 2015-06-28 MED ORDER — METOPROLOL TARTRATE 25 MG PO TABS: 12.5000 mg | ORAL_TABLET | Freq: Two times a day (BID) | ORAL | Status: DC

## 2015-06-28 NOTE — Addendum Note (Signed)
Addended byEulas Post, Brittnye Josephs on: 06/28/2015 04:04 PM   Modules accepted: Miquel Dunn

## 2015-06-28 NOTE — Progress Notes (Addendum)
Cardiology Office Note   Date:  06/28/2015   ID:  [name], DOB September 22, 1945, MRN [id number]  PCP:  Walker Kehr, MD  Cardiologist:  New - Dr. Angelena Form  Chief Complaint  Patient presents with  . New Patient (Initial Visit)    palpitation, seen with DOD Dr. Angelena Form  . Hypertension      History of Present Illness: Patrick Morgan is a 69 y.o. male who was presents new patient evaluation of palpitation for the past 6 weeks referred by his PCP Dr. Alain Marion. He has long standing history of HTN and has taken atenolol and bennicar in the past, however now is better on losartan. According to the patient, he was started on tramadol for chronic pain radiating down both legs recently and shortly after he started noticing palpitation and skipped heart beat. He later had fatigue and dizziness and went to ED on 06/08/2015 who noted his blood glucose was 162, he was started on Metformin, since then he has noticeable significantly more energy and no longer has any dizziness. During ED visit, he had sinus tachycardia with PVCs. D-dimer was negative. He says prior to that he eat whatever he wants to eat including cookie and sugar. Suprisingly his PCP checked his A1C on 12/14 which came back normal at 4.9. He is retired but fairly active and owns his body and enjoy breaking down and fixing old cars. The most strenuous activity he has done was to rake the leaves in backyard. He denies ever having any chest pain even with exertion. In fact, axcercise actually make him feel better and give him more energy. After his last visit, his PCP started on amlodipine 5mg  to better control his blood pressure.   He was referred to cardiology service today for evaluation of palpitation. He denies despite having more energy and no dizziness now, he continue to have daily palpitation, in fact, the longest he went on without palpitation was a hour. His recent TSH was negative. Echocardiogram was obtained on 06/27/2015  which was normal EF 60-65%, no RMWA, no valvular disease.      Past Medical History  Diagnosis Date  . Hypertension   . Vertigo     Past Surgical History  Procedure Laterality Date  . No past surgeries       Current Outpatient Prescriptions  Medication Sig Dispense Refill  . amLODipine (NORVASC) 5 MG tablet Take 1 tablet (5 mg total) by mouth daily. 30 tablet 11  . aspirin 81 MG tablet Take 81 mg by mouth daily.    . cholecalciferol (VITAMIN D) 1000 UNITS tablet Take 1 tablet (1,000 Units total) by mouth daily. 100 tablet 3  . losartan (COZAAR) 100 MG tablet Take 1 tablet (100 mg total) by mouth daily. 90 tablet 3  . metFORMIN (GLUCOPHAGE) 1000 MG tablet Take 1 tablet (1,000 mg total) by mouth daily with breakfast. 30 tablet 11  . Multiple Vitamin (MULTIVITAMIN) tablet Take 1 tablet by mouth daily.    . traMADol (ULTRAM) 50 MG tablet take 1-2 tablets by mouth twice a day if needed 120 tablet 2  . metoprolol tartrate (LOPRESSOR) 25 MG tablet Take 0.5 tablets (12.5 mg total) by mouth 2 (two) times daily. 90 tablet 3   No current facility-administered medications for this visit.    Allergies:   Codeine    Social History:  The patient  reports that he has quit smoking. He has never used smokeless tobacco. He reports that he does not drink  alcohol or use illicit drugs.   Family History:  The patient's family history includes Alzheimer's disease in his mother; Cancer (age of onset: 65) in his father; Diabetes in his father.    ROS:  Please see the history of present illness.   Otherwise, review of systems are positive for palpitation.   All other systems are reviewed and negative.    PHYSICAL EXAM: VS:  BP 156/68 mmHg  Pulse 102  Ht 5' 10.5" (1.791 m)  Wt 205 lb 6.4 oz (93.169 kg)  BMI 29.05 kg/m2 , BMI Body mass index is 29.05 kg/(m^2). GEN: Well nourished, well developed, in no acute distress HEENT: normal Neck: no JVD, carotid bruits, or masses Cardiac: regular  irregular, dropped beat on every fourth heart beat; no murmurs, rubs, or gallops,no edema  Respiratory:  clear to auscultation bilaterally, normal work of breathing GI: soft, nontender, nondistended, + BS MS: no deformity or atrophy Skin: warm and dry, no rash Neuro:  Strength and sensation are intact Psych: euthymic mood, full affect   EKG:  EKG is ordered today. The ekg ordered today demonstrates sinus tach without significant ST-T wave changes   Recent Labs: 06/12/2015: ALT 52; BUN 19; Creatinine, Ser 0.92; Hemoglobin 15.1; Platelets 235.0; Potassium 4.0; Sodium 139; TSH 1.22    Lipid Panel    Component Value Date/Time   CHOL 149 06/12/2015 0846   TRIG 84.0 06/12/2015 0846   HDL 43.50 06/12/2015 0846   CHOLHDL 3 06/12/2015 0846   VLDL 16.8 06/12/2015 0846   LDLCALC 89 06/12/2015 0846      Wt Readings from Last 3 Encounters:  06/28/15 205 lb 6.4 oz (93.169 kg)  06/11/15 207 lb (93.895 kg)  06/08/15 210 lb (95.255 kg)      Other studies Reviewed: Additional studies/ records that were reviewed today include:   Echo 06/27/2015 LV EF: 60% -  65%  ------------------------------------------------------------------- Indications:   Murmur (R01.1).  ------------------------------------------------------------------- Study Conclusions  - Left ventricle: The cavity size was normal. Systolic function was normal. The estimated ejection fraction was in the range of 60% to 65%. Wall motion was normal; there were no regional wall motion abnormalities. - Atrial septum: No defect or patent foramen ovale was identified.   Review of the above records demonstrates:   Referred to cardiology for palpitation.evaluation.     ASSESSMENT AND PLAN:  1.  Palpitation likely symptomatic PVCs   - EKG shows sinus tach with HR 102, no obvious ST-T wave changes. Physical exam however support quadrigeminy.   - Discussed with Dr. Angelena Form, plan to obtain 24 hours holter  monitor, and will start metoprolol tartrate 12.5mg  BID.   -  i have instructed him to start metoprolol after return holter monitor as I want to see how many PVCs he has in a 24 hour period. Usually >10,000 PVCs in 24 hour period is abnormal, however normally it does not cause significant PVC induced cardiomyopathy unless >20,000 PVCs   2. HTN: BP 156/58, still elevated compare to last office visit. Plan to add BB after 24 holter monitor. I will prescribe him metoprolol 12.5mg  BID  - if he can tolerate 12.5mg  BID metoprolol, can potentially increase to 25mg  BID later and replace his amlodipine.   Current medicines are reviewed at length with the patient today.  The patient does not have concerns regarding medicines.  The following changes have been made:  Start metoprolol after returning holter monitor  Labs/ tests ordered today include:   Orders Placed This Encounter  Procedures  . Holter monitor - 24 hour  . EKG 12-Lead     Disposition:   FU with cardiology in 2 weeks  Hilbert Corrigan PA 06/28/2015 3:59 PM    [address], [place], [place]  [address] Phone: [phone]; Fax: [phone]   I have personally seen this patient with Almyra Deforest, PA. I agree with the assessment and plan as outlined above. Plan cardiac monitor and start beta blocker following monitor.   MCALHANY,CHRISTOPHER 06/28/2015 5:02 PM

## 2015-06-28 NOTE — Patient Instructions (Addendum)
Medication Instructions:  Your physician has recommended you make the following change in your medication:  1.  START the Metoprolol 25 mg taking 1/2 tablet twice a day (DO NOT START UNTIL AFTER YOU RETURN THE HEART MONITOR)  Labwork: None ordered  Testing/Procedures: Your physician has recommended that you wear a holter monitor. Holter monitors are medical devices that record the heart's electrical activity. Doctors most often use these monitors to diagnose arrhythmias. Arrhythmias are problems with the speed or rhythm of the heartbeat. The monitor is a small, portable device. You can wear one while you do your normal daily activities. This is usually used to diagnose what is causing palpitations/syncope (passing out).    Follow-Up: Your physician recommends that you schedule a follow-up appointment in:  2 [place] [facility]   Any Other Special Instructions Will Be Listed Below (If Applicable).   Holter Monitoring A Holter monitor is a small device that is used to detect abnormal heart rhythms. It clips to your clothing and is connected by wires to flat, sticky disks (electrodes) that attach to your chest. It is worn continuously for 24-48 hours. HOME CARE INSTRUCTIONS  Wear your Holter monitor at all times, even while exercising and sleeping, for as long as directed by your health care provider.  Make sure that the Holter monitor is safely clipped to your clothing or close to your body as recommended by your health care provider.  Do not get the monitor or wires wet.  Do not put body lotion or moisturizer on your chest.  Keep your skin clean.  Keep a diary of your daily activities, such as walking and doing chores. If you feel that your heartbeat is abnormal or that your heart is fluttering or skipping a beat:  Record what you are doing when it happens.  Record what time of day the symptoms occur.  Return your Holter monitor as directed by your health care  provider.  Keep all follow-up visits as directed by your health care provider. This is important. SEEK IMMEDIATE MEDICAL CARE IF:  You feel lightheaded or you faint.  You have trouble breathing.  You feel pain in your chest, upper arm, or jaw.  You feel sick to your stomach and your skin is pale, cool, or damp.  You heartbeat feels unusual or abnormal.   This information is not intended to replace advice given to you by your health care provider. Make sure you discuss any questions you have with your health care provider.   Document Released: 03/13/2004 Document Revised: 07/06/2014 Document Reviewed: 01/22/2014 Elsevier Interactive Patient Education [employer].  If you need a refill on your cardiac medications before your next appointment, please call your pharmacy.

## 2015-07-05 ENCOUNTER — Telehealth: Payer: Self-pay | Admitting: *Deleted

## 2015-07-05 NOTE — Telephone Encounter (Signed)
Called pt, per Almyra Deforest, PA-C, and spoke with pt's wife to let them know that his hear monitor results only showed some premature heart beat, but nothing that was nothing serious and just to continue the Metoprolol and it should suppress it without a problem.  She verbalized understanding and will pass the information to the pt.

## 2015-07-05 NOTE — Telephone Encounter (Signed)
-----   Message from [place], [place] sent at 07/03/2015  4:25 PM EST ----- Had some premature heart beat, not the degree is not enough to be serious, continue on low dose metoprolol should suppress it without problem.

## 2015-07-11 ENCOUNTER — Encounter: Payer: Self-pay | Admitting: Internal Medicine

## 2015-07-11 ENCOUNTER — Ambulatory Visit (INDEPENDENT_AMBULATORY_CARE_PROVIDER_SITE_OTHER): Payer: Medicare Other | Admitting: Internal Medicine

## 2015-07-11 VITALS — BP 139/64 | HR 98 | Wt 203.0 lb

## 2015-07-11 DIAGNOSIS — R972 Elevated prostate specific antigen [PSA]: Secondary | ICD-10-CM

## 2015-07-11 DIAGNOSIS — R739 Hyperglycemia, unspecified: Secondary | ICD-10-CM

## 2015-07-11 DIAGNOSIS — M5440 Lumbago with sciatica, unspecified side: Secondary | ICD-10-CM | POA: Diagnosis not present

## 2015-07-11 DIAGNOSIS — R202 Paresthesia of skin: Secondary | ICD-10-CM | POA: Diagnosis not present

## 2015-07-11 DIAGNOSIS — Z0000 Encounter for general adult medical examination without abnormal findings: Secondary | ICD-10-CM

## 2015-07-11 DIAGNOSIS — N320 Bladder-neck obstruction: Secondary | ICD-10-CM

## 2015-07-11 DIAGNOSIS — E785 Hyperlipidemia, unspecified: Secondary | ICD-10-CM

## 2015-07-11 NOTE — Assessment & Plan Note (Addendum)
12/16  On a much better diet, will stay off Metformin for now

## 2015-07-11 NOTE — Progress Notes (Signed)
Pre visit review using our clinic review tool, if applicable. No additional management support is needed unless otherwise documented below in the visit note. 

## 2015-07-11 NOTE — Assessment & Plan Note (Addendum)

## 2015-07-11 NOTE — Assessment & Plan Note (Signed)
Monitor PSA 

## 2015-07-11 NOTE — Assessment & Plan Note (Signed)
Tramadol prn 

## 2015-07-11 NOTE — Patient Instructions (Signed)

## 2015-07-11 NOTE — Assessment & Plan Note (Signed)
Resolved

## 2015-07-11 NOTE — Progress Notes (Signed)
Subjective:  Patient ID: Patrick Morgan, male    DOB: 08/24/1945  Age: 70 y.o. MRN: [id number]  CC: No chief complaint on file.   HPI Patrick Morgan presents for fatigue and elevated sugar f/u - much better. Pt stopped Metformin. No palpitations. Pt's fatigue has resolved 100% - likely post-viral. Pt is here for a well exam  Outpatient Prescriptions Prior to Visit  Medication Sig Dispense Refill  . amLODipine (NORVASC) 5 MG tablet Take 1 tablet (5 mg total) by mouth daily. 30 tablet 11  . aspirin 81 MG tablet Take 81 mg by mouth daily.    . cholecalciferol (VITAMIN D) 1000 UNITS tablet Take 1 tablet (1,000 Units total) by mouth daily. 100 tablet 3  . losartan (COZAAR) 100 MG tablet Take 1 tablet (100 mg total) by mouth daily. 90 tablet 3  . metoprolol tartrate (LOPRESSOR) 25 MG tablet Take 0.5 tablets (12.5 mg total) by mouth 2 (two) times daily. 90 tablet 3  . Multiple Vitamin (MULTIVITAMIN) tablet Take 1 tablet by mouth daily.    . traMADol (ULTRAM) 50 MG tablet take 1-2 tablets by mouth twice a day if needed 120 tablet 2  . metFORMIN (GLUCOPHAGE) 1000 MG tablet Take 1 tablet (1,000 mg total) by mouth daily with breakfast. 30 tablet 11   No facility-administered medications prior to visit.    ROS Review of Systems  Constitutional: Negative for appetite change, fatigue and unexpected weight change.  HENT: Negative for congestion, nosebleeds, sneezing, sore throat and trouble swallowing.   Eyes: Negative for itching and visual disturbance.  Respiratory: Negative for cough.   Cardiovascular: Negative for chest pain, palpitations and leg swelling.  Gastrointestinal: Negative for nausea, diarrhea, blood in stool and abdominal distention.  Genitourinary: Negative for frequency and hematuria.  Musculoskeletal: Negative for back pain, joint swelling, gait problem and neck pain.  Skin: Negative for rash.  Neurological: Negative for dizziness, tremors, speech difficulty and weakness.    Psychiatric/Behavioral: Negative for suicidal ideas, sleep disturbance, dysphoric mood, decreased concentration and agitation. The patient is not nervous/anxious.     Objective:  BP 140/64 mmHg  Pulse 98  Wt 203 lb (92.08 kg)  SpO2 98%  BP Readings from Last 3 Encounters:  07/11/15 140/64  06/28/15 156/68  06/11/15 150/58    Wt Readings from Last 3 Encounters:  07/11/15 203 lb (92.08 kg)  06/28/15 205 lb 6.4 oz (93.169 kg)  06/11/15 207 lb (93.895 kg)    Physical Exam  Constitutional: He is oriented to person, place, and time. He appears well-developed and well-nourished. No distress.  HENT:  Head: Normocephalic and atraumatic.  Right Ear: External ear normal.  Left Ear: External ear normal.  Nose: Nose normal.  Mouth/Throat: Oropharynx is clear and moist. No oropharyngeal exudate.  Eyes: Conjunctivae and EOM are normal. Pupils are equal, round, and reactive to light. Right eye exhibits no discharge. Left eye exhibits no discharge. No scleral icterus.  Neck: Normal range of motion. Neck supple. No JVD present. No tracheal deviation present. No thyromegaly present.  Cardiovascular: Normal rate, regular rhythm, normal heart sounds and intact distal pulses.  Exam reveals no gallop and no friction rub.   No murmur heard. Pulmonary/Chest: Effort normal and breath sounds normal. No stridor. No respiratory distress. He has no wheezes. He has no rales. He exhibits no tenderness.  Abdominal: Soft. Bowel sounds are normal. He exhibits no distension and no mass. There is no tenderness. There is no rebound and no guarding.  Musculoskeletal:  Normal range of motion. He exhibits no edema or tenderness.  Lymphadenopathy:    He has no cervical adenopathy.  Neurological: He is alert and oriented to person, place, and time. He has normal reflexes. No cranial nerve deficit. He exhibits normal muscle tone. Coordination normal.  Skin: Skin is warm and dry. No rash noted. He is not diaphoretic. No  erythema. No pallor.  Psychiatric: He has a normal mood and affect. His behavior is normal. Judgment and thought content normal.  Pt refused rectal  Lab Results  Component Value Date   WBC 8.5 06/12/2015   HGB 15.1 06/12/2015   HCT 44.4 06/12/2015   PLT 235.0 06/12/2015   GLUCOSE 103* 06/12/2015   CHOL 149 06/12/2015   TRIG 84.0 06/12/2015   HDL 43.50 06/12/2015   LDLCALC 89 06/12/2015   ALT 52 06/12/2015   AST 32 06/12/2015   NA 139 06/12/2015   K 4.0 06/12/2015   CL 104 06/12/2015   CREATININE 0.92 06/12/2015   BUN 19 06/12/2015   CO2 26 06/12/2015   TSH 1.22 06/12/2015   PSA 2.22 06/18/2014   HGBA1C 4.9 06/12/2015    Dg Chest 2 View  06/08/2015  ADDENDUM REPORT: 06/08/2015 10:30 ADDENDUM: Impression should read:  No edema or consolidation. Electronically Signed   By: Lowella Grip III M.D.   On: 06/08/2015 10:30  06/08/2015  CLINICAL DATA:  Cardiac palpitations for 2 weeks.  Hypertension. EXAM: CHEST  2 VIEW COMPARISON:  None. FINDINGS: Lungs are clear. Heart size and pulmonary vascularity are normal. No adenopathy. There is degenerative change thoracic spine. IMPRESSION: Edema or consolidation. Electronically Signed: By: Lowella Grip III M.D. On: 06/08/2015 10:26    Assessment & Plan:   There are no diagnoses linked to this encounter. I have discontinued Mr. Bloxsom metFORMIN. I am also having him maintain his aspirin, losartan, cholecalciferol, traMADol, multivitamin, amLODipine, and metoprolol tartrate.  No orders of the defined types were placed in this encounter.     Follow-up: No Follow-up on file.  Walker Kehr, MD

## 2015-07-15 ENCOUNTER — Encounter: Payer: Self-pay | Admitting: Cardiology

## 2015-07-15 ENCOUNTER — Ambulatory Visit (INDEPENDENT_AMBULATORY_CARE_PROVIDER_SITE_OTHER): Payer: Medicare Other | Admitting: Cardiology

## 2015-07-15 VITALS — BP 148/72 | HR 92 | Ht 70.5 in | Wt 203.4 lb

## 2015-07-15 DIAGNOSIS — R002 Palpitations: Secondary | ICD-10-CM | POA: Diagnosis not present

## 2015-07-15 MED ORDER — METOPROLOL TARTRATE 25 MG PO TABS: 25.0000 mg | ORAL_TABLET | Freq: Two times a day (BID) | ORAL | Status: DC

## 2015-07-15 NOTE — Patient Instructions (Addendum)
Medication Instructions:  Your physician has recommended you make the following change in your medication:  INCREASE Metoprolol to 25mg  twice daily  Labwork: None ordered  Testing/Procedures: None ordered  Follow-Up: Your physician wants you to follow-up in: 1 year with Dr.Mcalhany You will receive a reminder letter in the mail two months in advance. If you don't receive a letter, please call our office to schedule the follow-up appointment.   Any Other Special Instructions Will Be Listed Below (If Applicable).     If you need a refill on your cardiac medications before your next appointment, please call your pharmacy.

## 2015-07-15 NOTE — Progress Notes (Signed)
07/15/2015 Patrick Morgan   March 18, 1946  [id number]  Primary Physician Walker Kehr, MD Primary Cardiologist: Dr. Angelena Form   Reason for Visit/CC: F/u for Palpitations  HPI:  Patrick Morgan is a 70 year old male who presents to clinic for two-week follow-up. He has a long-standing history of hypertension as well as a history of diabetes. He was seen 2 weeks ago as a new patient for evaluation for palpitations. He was referred by his PCP. He was seen by Almyra Deforest, PA-C, and Dr. Angelena Form. EKG showed PVCs. Subsequently a 24-hour heart monitor was arranged to assess for PVC burden as this was felt to be the cause of his symptoms. This mainly showed sinus rhythm with rare PVCs. There were frequent PACs but no atrial fibrillation. He was instructed to start 12.5 mg of Metroprolol.   Also of importance, his PCP also recently checked a TSH which was negative. An echocardiogram was also obtained by his PCP on 06/27/2015. This was a normal study with normal EF of 60-65%. There were no regional wall motion abnormalities and no valvular disease.  Today in follow-up, he reports that he has done fairly well. He has noticed significant improvement with low-dose Metroprolol however he continues to have occasional palpitations. He has tolerated Metroprolol well without any side effects. He reports full medication compliance. He has avoided caffeine. He is not a smoker. Heart rate today is 92 bpm. Blood pressure 148/72.   Current Outpatient Prescriptions  Medication Sig Dispense Refill  . amLODipine (NORVASC) 5 MG tablet Take 1 tablet (5 mg total) by mouth daily. 30 tablet 11  . aspirin 81 MG tablet Take 81 mg by mouth daily.    . cholecalciferol (VITAMIN D) 1000 UNITS tablet Take 1 tablet (1,000 Units total) by mouth daily. 100 tablet 3  . losartan (COZAAR) 100 MG tablet Take 1 tablet (100 mg total) by mouth daily. 90 tablet 3  . metoprolol tartrate (LOPRESSOR) 25 MG tablet Take 0.5 tablets (12.5 mg total) by  mouth 2 (two) times daily. 90 tablet 3  . Multiple Vitamin (MULTIVITAMIN) tablet Take 1 tablet by mouth daily.    . traMADol (ULTRAM) 50 MG tablet take 1-2 tablets by mouth twice a day if needed 120 tablet 2   No current facility-administered medications for this visit.    Allergies  Allergen Reactions  . Codeine Nausea Only    Social History   Social History  . Marital Status: Married    Spouse Name: N/A  . Number of Children: N/A  . Years of Education: N/A   Occupational History  . Not on file.   Social History Main Topics  . Smoking status: Former Research scientist (life sciences)  . Smokeless tobacco: Never Used     Comment: quit 1975  . Alcohol Use: No     Comment: quit 2002  . Drug Use: No  . Sexual Activity: Yes   Other Topics Concern  . Not on file   Social History Narrative     Review of Systems: General: negative for chills, fever, night sweats or weight changes.  Cardiovascular: negative for chest pain, dyspnea on exertion, edema, orthopnea, palpitations, paroxysmal nocturnal dyspnea or shortness of breath Dermatological: negative for rash Respiratory: negative for cough or wheezing Urologic: negative for hematuria Abdominal: negative for nausea, vomiting, diarrhea, bright red blood per rectum, melena, or hematemesis Neurologic: negative for visual changes, syncope, or dizziness All other systems reviewed and are otherwise negative except as noted above.    Blood pressure 148/72, pulse 92,  height 5' 10.5" (1.791 m), weight 203 lb 6.4 oz (92.262 kg), SpO2 99 %.  General appearance: alert, cooperative and no distress Neck: no carotid bruit and no JVD Lungs: clear to auscultation bilaterally Heart: regular rate and rhythm, S1, S2 normal, no murmur, click, rub or gallop Extremities: no LEE Pulses: 2+ and symmetric Skin: warm and dry Neurologic: Grossly normal  EKG not performed  ASSESSMENT AND PLAN:   1. Palpitations:  24-hour heart heart monitor showed mainly sinus rhythm  with rare PVCs and frequent PACs. There was no atrial fibrillation. Recent TSH was normal. Recent 2-D echo 06/27/2015 was normal with an EF of 60-65%, with no wall motion abnormalities and no valvular abnormalities. He has tolerated low-dose Metroprolol well with significant improvement, however he continues to have occasional palpitations. He denies any excessive caffeine intake. His blood pressure today is 148/72 and pulse rate is 92 bpm. We will further increase his Metroprolol 25 mg twice a day for further suppression of ectopy, reduction of heart rate as well as additional blood pressure coverage.  2. Hypertension. Blood pressure is moderately elevated at 148/72. We will increase his Metroprolol to 25 mg twice a day as outlined above. He is also to continue his amlodipine and losartan. This can continue to be monitored by his PCP.  PLAN  F/u  with Dr. Angelena Form in 1 year.   Lyda Jester PA-C 07/15/2015 9:56 AM

## 2015-08-06 ENCOUNTER — Telehealth: Payer: Self-pay | Admitting: Internal Medicine

## 2015-08-06 NOTE — Telephone Encounter (Signed)
Pt called to follow up on the handicap placard that was dropped off a few weeks ago. Can you please fill another one out. Not sure if you can fax before you put in mail. If so, fax # is [phone]

## 2015-08-06 NOTE — Telephone Encounter (Signed)
Form completed, signed and mailed. Pt aware

## 2015-09-25 ENCOUNTER — Telehealth: Payer: Self-pay

## 2015-09-25 NOTE — Telephone Encounter (Signed)
Recd faxed rx refill request for ibuprofen 600mg  tab from [facility] on [address]---i'm not showing on patient's current med list---please advise, thanks

## 2015-09-26 MED ORDER — IBUPROFEN 600 MG PO TABS: 600.0000 mg | ORAL_TABLET | Freq: Two times a day (BID) | ORAL | Status: DC | PRN

## 2015-09-26 NOTE — Telephone Encounter (Signed)
Done. Thx.

## 2015-09-26 NOTE — Addendum Note (Signed)
Addended by: Cassandria Anger on: 09/26/2015 12:01 AM   Modules accepted: Orders

## 2015-10-09 ENCOUNTER — Other Ambulatory Visit (INDEPENDENT_AMBULATORY_CARE_PROVIDER_SITE_OTHER): Payer: Medicare Other

## 2015-10-09 DIAGNOSIS — E785 Hyperlipidemia, unspecified: Secondary | ICD-10-CM | POA: Diagnosis not present

## 2015-10-09 DIAGNOSIS — Z0000 Encounter for general adult medical examination without abnormal findings: Secondary | ICD-10-CM | POA: Diagnosis not present

## 2015-10-09 DIAGNOSIS — M5440 Lumbago with sciatica, unspecified side: Secondary | ICD-10-CM

## 2015-10-09 DIAGNOSIS — R739 Hyperglycemia, unspecified: Secondary | ICD-10-CM

## 2015-10-09 DIAGNOSIS — N320 Bladder-neck obstruction: Secondary | ICD-10-CM | POA: Diagnosis not present

## 2015-10-09 LAB — LIPID PANEL
CHOLESTEROL: 165 mg/dL (ref 0–200)
HDL: 42.6 mg/dL (ref >39.00)
LDL CALC: 109 mg/dL — AB (ref 0–99)
NONHDL: 122.39
Total CHOL/HDL Ratio: 4
Triglycerides: 68 mg/dL (ref 0.0–149.0)
VLDL: 13.6 mg/dL (ref 0.0–40.0)

## 2015-10-09 LAB — CBC WITH DIFFERENTIAL/PLATELET
BASOS PCT: 0.3 % (ref 0.0–3.0)
Basophils Absolute: 0 10*3/uL (ref 0.0–0.1)
EOS ABS: 0.2 10*3/uL (ref 0.0–0.7)
EOS PCT: 2.7 % (ref 0.0–5.0)
HEMATOCRIT: 43.1 % (ref 39.0–52.0)
HEMOGLOBIN: 14.7 g/dL (ref 13.0–17.0)
LYMPHS PCT: 22.7 % (ref 12.0–46.0)
Lymphs Abs: 1.6 10*3/uL (ref 0.7–4.0)
MCHC: 34.2 g/dL (ref 30.0–36.0)
MCV: 101.7 fl — ABNORMAL HIGH (ref 78.0–100.0)
MONO ABS: 0.6 10*3/uL (ref 0.1–1.0)
Monocytes Relative: 9.1 % (ref 3.0–12.0)
NEUTROS ABS: 4.6 10*3/uL (ref 1.4–7.7)
Neutrophils Relative %: 65.2 % (ref 43.0–77.0)
PLATELETS: 202 10*3/uL (ref 150.0–400.0)
RBC: 4.24 Mil/uL (ref 4.22–5.81)
RDW: 13.1 % (ref 11.5–15.5)
WBC: 7 10*3/uL (ref 4.0–10.5)

## 2015-10-09 LAB — BASIC METABOLIC PANEL
BUN: 24 mg/dL — AB (ref 6–23)
CHLORIDE: 106 meq/L (ref 96–112)
CO2: 29 meq/L (ref 19–32)
CREATININE: 0.88 mg/dL (ref 0.40–1.50)
Calcium: 10 mg/dL (ref 8.4–10.5)
GFR: 110.24 mL/min (ref >60.00)
Glucose, Bld: 100 mg/dL — ABNORMAL HIGH (ref 70–99)
POTASSIUM: 4.5 meq/L (ref 3.5–5.1)
Sodium: 141 mEq/L (ref 135–145)

## 2015-10-09 LAB — URINALYSIS
BILIRUBIN URINE: NEGATIVE
HGB URINE DIPSTICK: NEGATIVE
KETONES UR: NEGATIVE
Leukocytes, UA: NEGATIVE
Nitrite: NEGATIVE
PH: 6 (ref 5.0–8.0)
Specific Gravity, Urine: 1.025 (ref 1.000–1.030)
Total Protein, Urine: NEGATIVE
Urine Glucose: NEGATIVE
Urobilinogen, UA: 0.2 (ref 0.0–1.0)

## 2015-10-09 LAB — HEPATIC FUNCTION PANEL
ALT: 15 U/L (ref 0–53)
AST: 16 U/L (ref 0–37)
Albumin: 4.4 g/dL (ref 3.5–5.2)
Alkaline Phosphatase: 78 U/L (ref 39–117)
BILIRUBIN DIRECT: 0.2 mg/dL (ref 0.0–0.3)
BILIRUBIN TOTAL: 0.9 mg/dL (ref 0.2–1.2)
TOTAL PROTEIN: 7 g/dL (ref 6.0–8.3)

## 2015-10-09 LAB — HEMOGLOBIN A1C: HEMOGLOBIN A1C: 4.8 % (ref 4.6–6.5)

## 2015-10-09 LAB — HEPATITIS C ANTIBODY: HCV AB: NEGATIVE

## 2015-10-09 LAB — TSH: TSH: 1.1 u[IU]/mL (ref 0.35–4.50)

## 2015-10-09 LAB — PSA: PSA: 2.44 ng/mL (ref 0.10–4.00)

## 2015-10-10 ENCOUNTER — Ambulatory Visit: Payer: Medicare Other | Admitting: Internal Medicine

## 2015-10-10 ENCOUNTER — Encounter: Payer: Self-pay | Admitting: Internal Medicine

## 2015-10-10 VITALS — BP 150/80 | HR 67 | Wt 196.0 lb

## 2015-10-10 DIAGNOSIS — R972 Elevated prostate specific antigen [PSA]: Secondary | ICD-10-CM | POA: Diagnosis not present

## 2015-10-10 DIAGNOSIS — M5440 Lumbago with sciatica, unspecified side: Secondary | ICD-10-CM | POA: Diagnosis not present

## 2015-10-10 DIAGNOSIS — I10 Essential (primary) hypertension: Secondary | ICD-10-CM

## 2015-10-10 DIAGNOSIS — Z0000 Encounter for general adult medical examination without abnormal findings: Secondary | ICD-10-CM

## 2015-10-10 DIAGNOSIS — Z1211 Encounter for screening for malignant neoplasm of colon: Secondary | ICD-10-CM

## 2015-10-10 MED ORDER — TRAMADOL HCL 50 MG PO TABS: ORAL_TABLET | ORAL | Status: DC

## 2015-10-10 MED ORDER — AMLODIPINE BESYLATE 5 MG PO TABS: 5.0000 mg | ORAL_TABLET | Freq: Every day | ORAL | Status: DC

## 2015-10-10 NOTE — Progress Notes (Signed)
Pre visit review using our clinic review tool, if applicable. No additional management support is needed unless otherwise documented below in the visit note. 

## 2015-10-10 NOTE — Assessment & Plan Note (Signed)
Monitor PSA 

## 2015-10-10 NOTE — Assessment & Plan Note (Signed)
BP 130-140/70 at home  Lopressor, Norvasc NAS diet

## 2015-10-10 NOTE — Progress Notes (Signed)
Subjective:  Patient ID: Patrick Morgan, male    DOB: Nov 13, 1945  Age: 70 y.o. MRN: [id number]  CC: No chief complaint on file.   HPI ZADOK GROMAN presents for a well exam F/u LBP, HTN, elevated glu. BP 130-140/70 at home  Outpatient Prescriptions Prior to Visit  Medication Sig Dispense Refill  . amLODipine (NORVASC) 5 MG tablet Take 1 tablet (5 mg total) by mouth daily. 30 tablet 11  . aspirin 81 MG tablet Take 81 mg by mouth daily.    . cholecalciferol (VITAMIN D) 1000 UNITS tablet Take 1 tablet (1,000 Units total) by mouth daily. 100 tablet 3  . ibuprofen (ADVIL,MOTRIN) 600 MG tablet Take 1 tablet (600 mg total) by mouth 2 (two) times daily as needed for moderate pain. 60 tablet 3  . losartan (COZAAR) 100 MG tablet Take 1 tablet (100 mg total) by mouth daily. 90 tablet 3  . metoprolol tartrate (LOPRESSOR) 25 MG tablet Take 1 tablet (25 mg total) by mouth 2 (two) times daily.    . Multiple Vitamin (MULTIVITAMIN) tablet Take 1 tablet by mouth daily.    . traMADol (ULTRAM) 50 MG tablet take 1-2 tablets by mouth twice a day if needed 120 tablet 2   No facility-administered medications prior to visit.    ROS Review of Systems  Constitutional: Negative for appetite change, fatigue and unexpected weight change.  HENT: Negative for congestion, nosebleeds, sneezing, sore throat and trouble swallowing.   Eyes: Negative for itching and visual disturbance.  Respiratory: Negative for cough.   Cardiovascular: Negative for chest pain, palpitations and leg swelling.  Gastrointestinal: Negative for nausea, diarrhea, blood in stool and abdominal distention.  Genitourinary: Negative for frequency and hematuria.  Musculoskeletal: Positive for back pain. Negative for joint swelling, gait problem and neck pain.  Skin: Negative for rash.  Neurological: Negative for dizziness, tremors, speech difficulty and weakness.  Psychiatric/Behavioral: Negative for suicidal ideas, sleep disturbance,  dysphoric mood and agitation. The patient is not nervous/anxious.     Objective:  BP 150/80 mmHg  Pulse 67  Wt 196 lb (88.905 kg)  SpO2 97%  BP Readings from Last 3 Encounters:  10/10/15 150/80  07/15/15 148/72  07/11/15 139/64    Wt Readings from Last 3 Encounters:  10/10/15 196 lb (88.905 kg)  07/15/15 203 lb 6.4 oz (92.262 kg)  07/11/15 203 lb (92.08 kg)    Physical Exam  Constitutional: He is oriented to person, place, and time. He appears well-developed. No distress.  NAD  HENT:  Mouth/Throat: Oropharynx is clear and moist.  Eyes: Conjunctivae are normal. Pupils are equal, round, and reactive to light.  Neck: Normal range of motion. No JVD present. No thyromegaly present.  Cardiovascular: Normal rate, regular rhythm, normal heart sounds and intact distal pulses.  Exam reveals no gallop and no friction rub.   No murmur heard. Pulmonary/Chest: Effort normal and breath sounds normal. No respiratory distress. He has no wheezes. He has no rales. He exhibits no tenderness.  Abdominal: Soft. Bowel sounds are normal. He exhibits no distension and no mass. There is no tenderness. There is no rebound and no guarding.  Musculoskeletal: Normal range of motion. He exhibits tenderness. He exhibits no edema.  Lymphadenopathy:    He has no cervical adenopathy.  Neurological: He is alert and oriented to person, place, and time. He has normal reflexes. No cranial nerve deficit. He exhibits normal muscle tone. He displays a negative Romberg sign. Coordination and gait normal.  Skin: Skin  is warm and dry. No rash noted.  Psychiatric: He has a normal mood and affect. His behavior is normal. Judgment and thought content normal.    Lab Results  Component Value Date   WBC 7.0 10/09/2015   HGB 14.7 10/09/2015   HCT 43.1 10/09/2015   PLT 202.0 10/09/2015   GLUCOSE 100* 10/09/2015   CHOL 165 10/09/2015   TRIG 68.0 10/09/2015   HDL 42.60 10/09/2015   LDLCALC 109* 10/09/2015   ALT 15  10/09/2015   AST 16 10/09/2015   NA 141 10/09/2015   K 4.5 10/09/2015   CL 106 10/09/2015   CREATININE 0.88 10/09/2015   BUN 24* 10/09/2015   CO2 29 10/09/2015   TSH 1.10 10/09/2015   PSA 2.44 10/09/2015   HGBA1C 4.8 10/09/2015    Dg Chest 2 View  06/08/2015  ADDENDUM REPORT: 06/08/2015 10:30 ADDENDUM: Impression should read:  No edema or consolidation. Electronically Signed   By: Lowella Grip III M.D.   On: 06/08/2015 10:30  06/08/2015  CLINICAL DATA:  Cardiac palpitations for 2 weeks.  Hypertension. EXAM: CHEST  2 VIEW COMPARISON:  None. FINDINGS: Lungs are clear. Heart size and pulmonary vascularity are normal. No adenopathy. There is degenerative change thoracic spine. IMPRESSION: Edema or consolidation. Electronically Signed: By: Lowella Grip III M.D. On: 06/08/2015 10:26    Assessment & Plan:   There are no diagnoses linked to this encounter. I am having Mr. Cuartas maintain his aspirin, losartan, cholecalciferol, traMADol, multivitamin, amLODipine, metoprolol tartrate, and ibuprofen.  No orders of the defined types were placed in this encounter.     Follow-up: No Follow-up on file.  Walker Kehr, MD

## 2015-10-10 NOTE — Assessment & Plan Note (Signed)
Tramadol prn ° Potential benefits of a long term opioids use as well as potential risks (i.e. addiction risk, apnea etc) and complications (i.e. Somnolence, constipation and others) were explained to the patient and were aknowledged. ° ° °

## 2015-10-13 ENCOUNTER — Encounter: Payer: Self-pay | Admitting: Internal Medicine

## 2015-10-13 NOTE — Assessment & Plan Note (Signed)

## 2015-10-28 ENCOUNTER — Other Ambulatory Visit: Payer: Self-pay | Admitting: *Deleted

## 2015-10-28 MED ORDER — LOSARTAN POTASSIUM 100 MG PO TABS: 100.0000 mg | ORAL_TABLET | Freq: Every day | ORAL | Status: DC

## 2015-12-27 ENCOUNTER — Other Ambulatory Visit: Payer: Self-pay | Admitting: *Deleted

## 2015-12-27 MED ORDER — METOPROLOL TARTRATE 25 MG PO TABS: 25.0000 mg | ORAL_TABLET | Freq: Two times a day (BID) | ORAL | Status: DC

## 2016-01-07 DIAGNOSIS — R140 Abdominal distension (gaseous): Secondary | ICD-10-CM | POA: Diagnosis not present

## 2016-01-07 DIAGNOSIS — Z1211 Encounter for screening for malignant neoplasm of colon: Secondary | ICD-10-CM | POA: Diagnosis not present

## 2016-02-19 DIAGNOSIS — K635 Polyp of colon: Secondary | ICD-10-CM | POA: Diagnosis not present

## 2016-02-19 DIAGNOSIS — Z86010 Personal history of colonic polyps: Secondary | ICD-10-CM | POA: Diagnosis not present

## 2016-02-19 DIAGNOSIS — D123 Benign neoplasm of transverse colon: Secondary | ICD-10-CM | POA: Diagnosis not present

## 2016-02-19 DIAGNOSIS — Z1211 Encounter for screening for malignant neoplasm of colon: Secondary | ICD-10-CM | POA: Diagnosis not present

## 2016-02-19 LAB — HM COLONOSCOPY

## 2016-03-13 DIAGNOSIS — Z23 Encounter for immunization: Secondary | ICD-10-CM | POA: Diagnosis not present

## 2016-04-07 ENCOUNTER — Ambulatory Visit (INDEPENDENT_AMBULATORY_CARE_PROVIDER_SITE_OTHER): Payer: Medicare Other | Admitting: Internal Medicine

## 2016-04-07 ENCOUNTER — Encounter: Payer: Self-pay | Admitting: Internal Medicine

## 2016-04-07 DIAGNOSIS — R739 Hyperglycemia, unspecified: Secondary | ICD-10-CM | POA: Diagnosis not present

## 2016-04-07 DIAGNOSIS — I10 Essential (primary) hypertension: Secondary | ICD-10-CM

## 2016-04-07 DIAGNOSIS — M5440 Lumbago with sciatica, unspecified side: Secondary | ICD-10-CM

## 2016-04-07 DIAGNOSIS — R972 Elevated prostate specific antigen [PSA]: Secondary | ICD-10-CM | POA: Diagnosis not present

## 2016-04-07 MED ORDER — TRAMADOL HCL 50 MG PO TABS: ORAL_TABLET | ORAL | 2 refills | Status: DC

## 2016-04-07 MED ORDER — VITAMIN D 1000 UNITS PO TABS: 1000.0000 [IU] | ORAL_TABLET | Freq: Every day | ORAL | 3 refills | Status: AC

## 2016-04-07 NOTE — Assessment & Plan Note (Signed)
On Metformin 

## 2016-04-07 NOTE — Assessment & Plan Note (Signed)
Lopressor, Norvasc NAS diet 

## 2016-04-07 NOTE — Progress Notes (Signed)
Pre visit review using our clinic review tool, if applicable. No additional management support is needed unless otherwise documented below in the visit note. 

## 2016-04-07 NOTE — Progress Notes (Signed)
Subjective:  Patient ID: Patrick Morgan, male    DOB: 1945/10/21  Age: 70 y.o. MRN: [id number]  CC: No chief complaint on file.   HPI Patrick Morgan presents for HTN, LBP f/u. Colon 2017  Outpatient Medications Prior to Visit  Medication Sig Dispense Refill  . amLODipine (NORVASC) 5 MG tablet Take 1 tablet (5 mg total) by mouth daily. 90 tablet 3  . aspirin 81 MG tablet Take 81 mg by mouth daily.    Marland Kitchen ibuprofen (ADVIL,MOTRIN) 600 MG tablet Take 1 tablet (600 mg total) by mouth 2 (two) times daily as needed for moderate pain. 60 tablet 3  . losartan (COZAAR) 100 MG tablet Take 1 tablet (100 mg total) by mouth daily. 90 tablet 3  . metoprolol tartrate (LOPRESSOR) 25 MG tablet Take 1 tablet (25 mg total) by mouth 2 (two) times daily. 180 tablet 1  . Multiple Vitamin (MULTIVITAMIN) tablet Take 1 tablet by mouth daily.    . traMADol (ULTRAM) 50 MG tablet take 1-2 tablets by mouth twice a day if needed 120 tablet 2   No facility-administered medications prior to visit.     ROS Review of Systems  Constitutional: Negative for appetite change, fatigue and unexpected weight change.  HENT: Negative for congestion, nosebleeds, sneezing, sore throat and trouble swallowing.   Eyes: Negative for itching and visual disturbance.  Respiratory: Negative for cough.   Cardiovascular: Negative for chest pain, palpitations and leg swelling.  Gastrointestinal: Negative for abdominal distention, blood in stool, diarrhea and nausea.  Genitourinary: Negative for frequency and hematuria.  Musculoskeletal: Positive for back pain. Negative for gait problem, joint swelling and neck pain.  Skin: Negative for rash.  Neurological: Negative for dizziness, tremors, speech difficulty and weakness.  Psychiatric/Behavioral: Negative for agitation, dysphoric mood and sleep disturbance. The patient is not nervous/anxious.     Objective:  BP 122/62   Pulse 68   Wt 200 lb (90.7 kg)   SpO2 97%   BMI 28.29 kg/m    BP Readings from Last 3 Encounters:  04/07/16 122/62  10/10/15 (!) 150/80  07/15/15 (!) 148/72    Wt Readings from Last 3 Encounters:  04/07/16 200 lb (90.7 kg)  10/10/15 196 lb (88.9 kg)  07/15/15 203 lb 6.4 oz (92.3 kg)    Physical Exam  Constitutional: He is oriented to person, place, and time. He appears well-developed. No distress.  NAD  HENT:  Mouth/Throat: Oropharynx is clear and moist.  Eyes: Conjunctivae are normal. Pupils are equal, round, and reactive to light.  Neck: Normal range of motion. No JVD present. No thyromegaly present.  Cardiovascular: Normal rate, regular rhythm, normal heart sounds and intact distal pulses.  Exam reveals no gallop and no friction rub.   No murmur heard. Pulmonary/Chest: Effort normal and breath sounds normal. No respiratory distress. He has no wheezes. He has no rales. He exhibits no tenderness.  Abdominal: Soft. Bowel sounds are normal. He exhibits no distension and no mass. There is no tenderness. There is no rebound and no guarding.  Musculoskeletal: Normal range of motion. He exhibits tenderness. He exhibits no edema.  Lymphadenopathy:    He has no cervical adenopathy.  Neurological: He is alert and oriented to person, place, and time. He has normal reflexes. No cranial nerve deficit. He exhibits normal muscle tone. He displays a negative Romberg sign. Coordination and gait normal.  Skin: Skin is warm and dry. No rash noted.  Psychiatric: He has a normal mood and affect. His  behavior is normal. Judgment and thought content normal.    Lab Results  Component Value Date   WBC 7.0 10/09/2015   HGB 14.7 10/09/2015   HCT 43.1 10/09/2015   PLT 202.0 10/09/2015   GLUCOSE 100 (H) 10/09/2015   CHOL 165 10/09/2015   TRIG 68.0 10/09/2015   HDL 42.60 10/09/2015   LDLCALC 109 (H) 10/09/2015   ALT 15 10/09/2015   AST 16 10/09/2015   NA 141 10/09/2015   K 4.5 10/09/2015   CL 106 10/09/2015   CREATININE 0.88 10/09/2015   BUN 24 (H)  10/09/2015   CO2 29 10/09/2015   TSH 1.10 10/09/2015   PSA 2.44 10/09/2015   HGBA1C 4.8 10/09/2015    Dg Chest 2 View  Addendum Date: 06/08/2015   ADDENDUM REPORT: 06/08/2015 10:30 ADDENDUM: Impression should read:  No edema or consolidation. Electronically Signed   By: Lowella Grip III M.D.   On: 06/08/2015 10:30  Result Date: 06/08/2015 CLINICAL DATA:  Cardiac palpitations for 2 weeks.  Hypertension. EXAM: CHEST  2 VIEW COMPARISON:  None. FINDINGS: Lungs are clear. Heart size and pulmonary vascularity are normal. No adenopathy. There is degenerative change thoracic spine. IMPRESSION: Edema or consolidation. Electronically Signed: By: Lowella Grip III M.D. On: 06/08/2015 10:26    Assessment & Plan:   There are no diagnoses linked to this encounter. I am having Patrick Morgan maintain his aspirin, multivitamin, ibuprofen, amLODipine, traMADol, losartan, and metoprolol tartrate.  No orders of the defined types were placed in this encounter.    Follow-up: No Follow-up on file.  Walker Kehr, MD

## 2016-04-07 NOTE — Assessment & Plan Note (Signed)
Monitor PSA 

## 2016-04-07 NOTE — Assessment & Plan Note (Signed)
Tramadol prn ° Potential benefits of a long term opioids use as well as potential risks (i.e. addiction risk, apnea etc) and complications (i.e. Somnolence, constipation and others) were explained to the patient and were aknowledged. ° ° °

## 2016-04-09 ENCOUNTER — Ambulatory Visit: Payer: Medicare Other | Admitting: Internal Medicine

## 2016-06-14 ENCOUNTER — Other Ambulatory Visit: Payer: Self-pay | Admitting: Internal Medicine

## 2016-06-14 ENCOUNTER — Other Ambulatory Visit: Payer: Self-pay | Admitting: Cardiology

## 2016-06-19 IMAGING — MR MR LUMBAR SPINE W/O CM
4 of 5 series · 25 of 48 positions shown · non-contrast
Comparison: None.

CLINICAL DATA: Low back pain with bilateral leg pain. Gait
disorder.

EXAM:
MRI LUMBAR SPINE WITHOUT CONTRAST
TECHNIQUE: Multiplanar, multisequence MR imaging of the lumbar spine was
performed. No intravenous contrast was administered.

[Series 4: T1 · sagittal · 4.0mm · 0.55mm/px · 6 of 12 slices shown (1 of 2)]
[im 1/12]
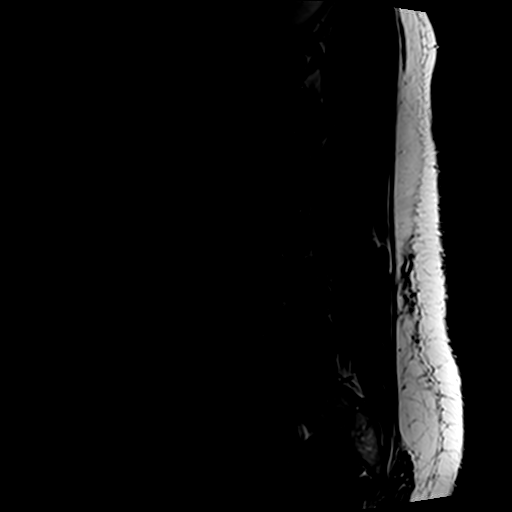
[im 3/12]
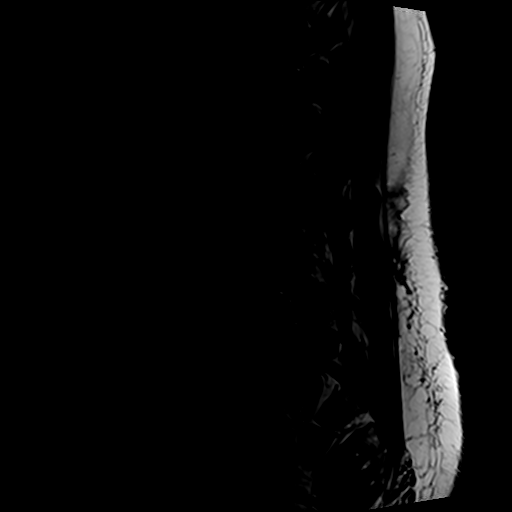
[im 5/12]
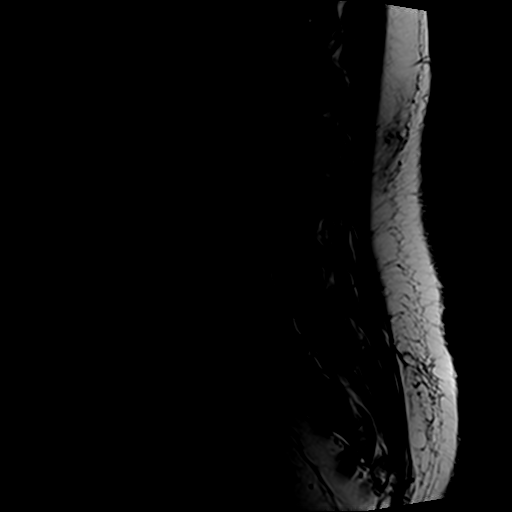
[im 7/12]
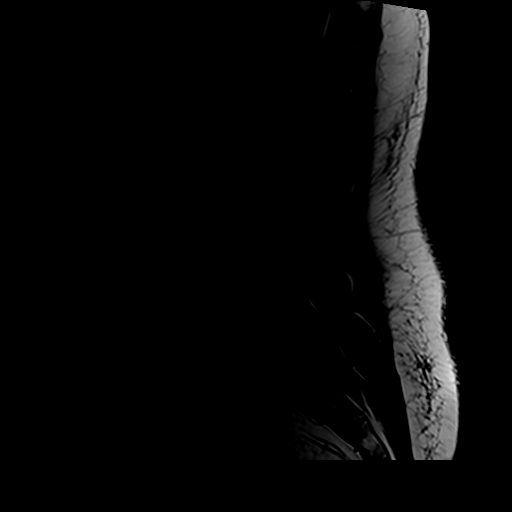
[im 9/12]
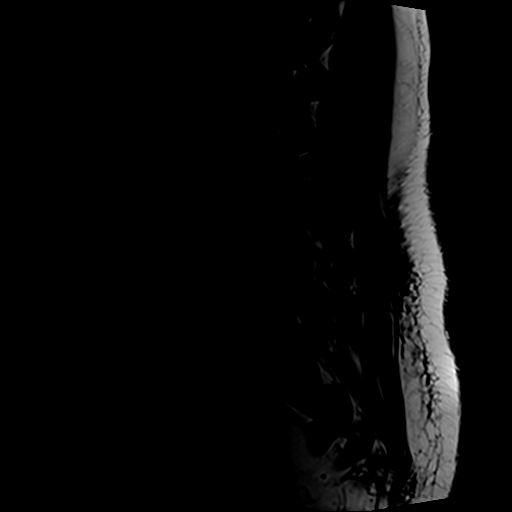
[im 12/12]
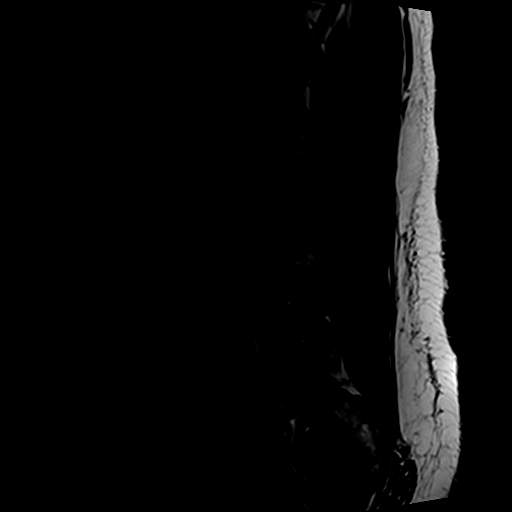

[Series 5: T2 post-contrast · sagittal · 4.0mm · 0.55mm/px · 6 of 12 slices shown]
[im 1/12]
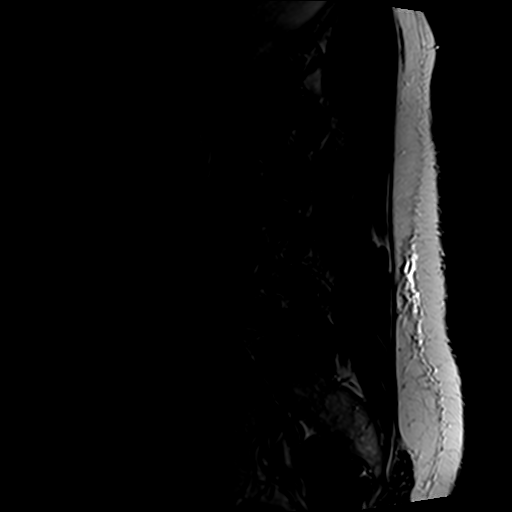
[im 3/12]
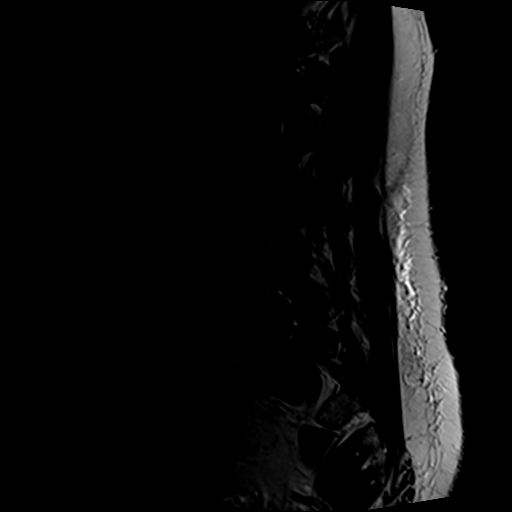
[im 5/12]
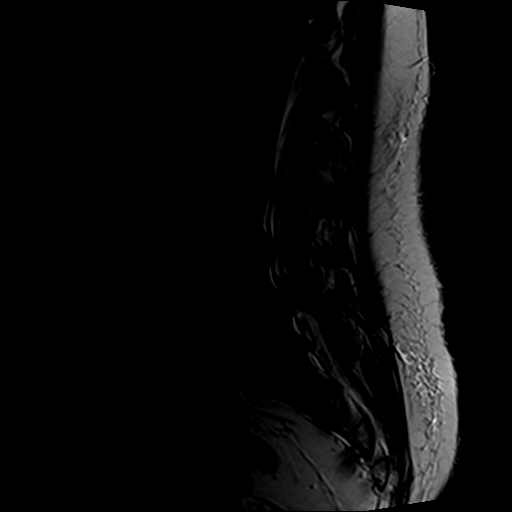
[im 7/12]
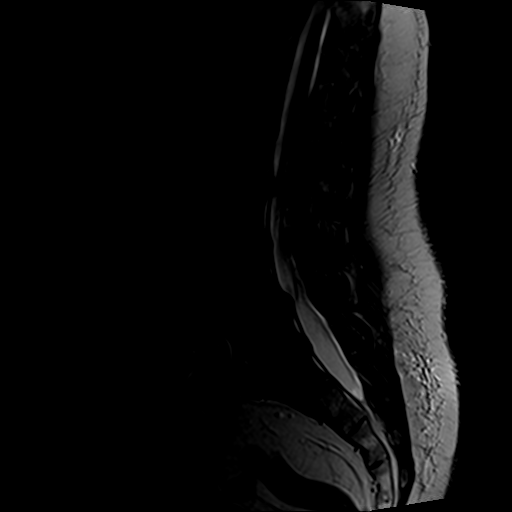
[im 9/12]
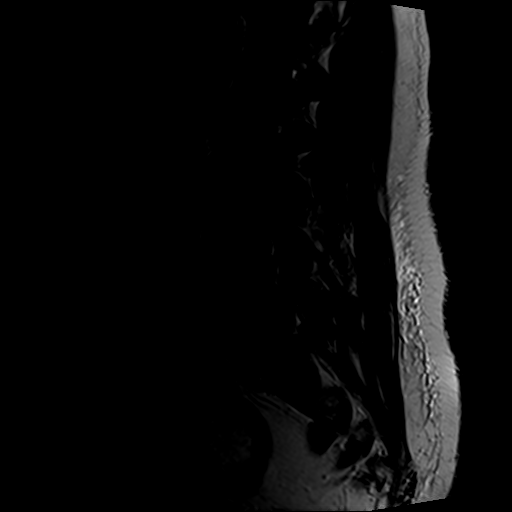
[im 12/12]
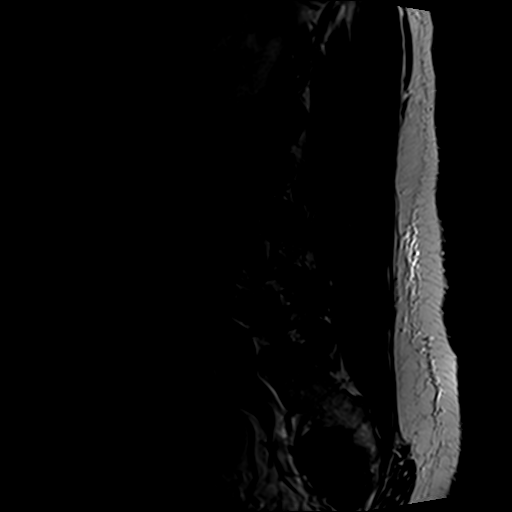

[Series 6: T2 · axial · 4.0mm · 0.70mm/px · z∈[-72,+99]mm · 9 of 32 slices shown]
[im 1/32]
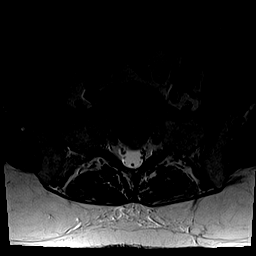
[im 5/32]
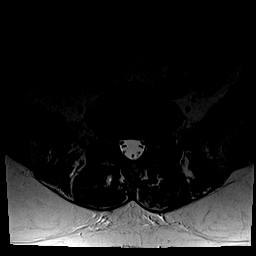
[im 9/32]
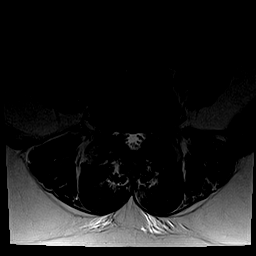
[im 14/32]
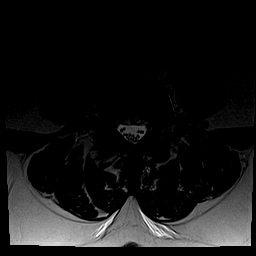
[im 16/32]
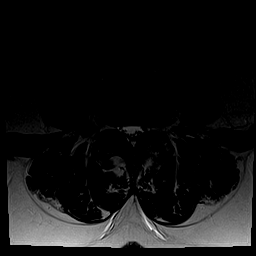
[im 18/32]
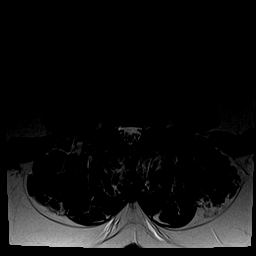
[im 23/32]
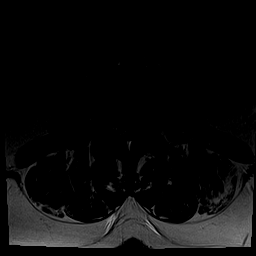
[im 27/32]
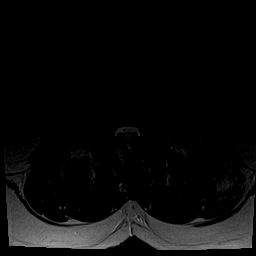
[im 32/32]
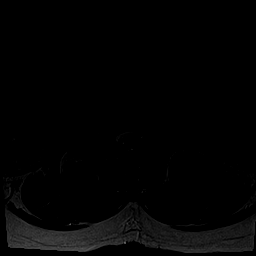

[Series 7: T1 · axial · 4.0mm · 0.35mm/px · z∈[-72,+75]mm · 4 of 32 slices shown (2 of 2)]
[im 1/32]
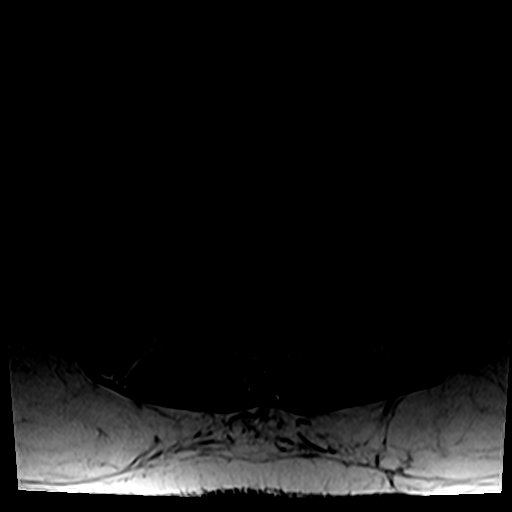
[im 5/32]
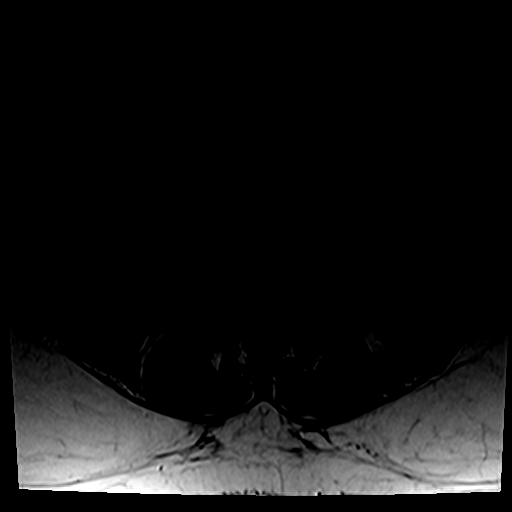
[im 16/32]
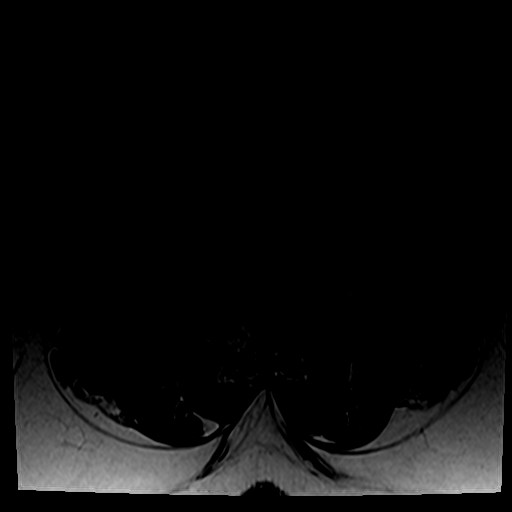
[im 27/32]
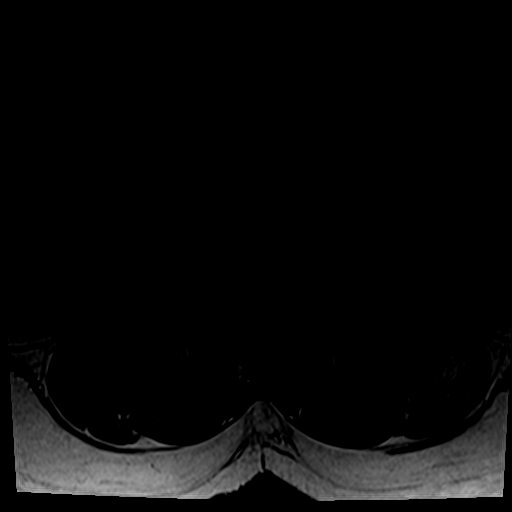

[25 of 48 positions shown; findings below may reference images not displayed]

FINDINGS: Negative for fracture or mass lesion. Conus medullaris is normal and
terminates at mid L2

L1-2:  Mild facet degeneration.  Normal disc space without stenosis

L2-3: Moderately large broad-based disc protrusion. Moderate facet
hypertrophy causing moderate to severe spinal stenosis. Mild
foraminal narrowing bilaterally

L3-4: Mild disc bulging. Moderate facet hypertrophy. Mild spinal
stenosis.

L4-5: 5 mm anterior slip. Diffuse disc bulging. Severe facet
hypertrophy. Moderate spinal stenosis. Mild foraminal narrowing
bilaterally.

L5-S1: Disc degeneration and spondylosis with diffuse endplate
osteophyte formation. Bilateral facet hypertrophy. Moderate
foraminal encroachment bilaterally.
IMPRESSION: Multilevel degenerative change as above.

Multilevel spinal stenosis most severe at L2-3 due to diffuse disc
protrusion and facet hypertrophy. Moderate to severe spinal stenosis
at L2-3.

Mild spinal stenosis at L3-4

Grade 1 slip L4-5 with moderate spinal stenosis.

## 2016-08-21 ENCOUNTER — Encounter: Payer: Self-pay | Admitting: Cardiovascular Disease

## 2016-08-21 ENCOUNTER — Ambulatory Visit (INDEPENDENT_AMBULATORY_CARE_PROVIDER_SITE_OTHER): Payer: Medicare Other | Admitting: Cardiovascular Disease

## 2016-08-21 VITALS — BP 156/80 | HR 74 | Ht 70.5 in | Wt 203.4 lb

## 2016-08-21 DIAGNOSIS — I493 Ventricular premature depolarization: Secondary | ICD-10-CM | POA: Diagnosis not present

## 2016-08-21 MED ORDER — METOPROLOL TARTRATE 25 MG PO TABS: 25.0000 mg | ORAL_TABLET | Freq: Two times a day (BID) | ORAL | 3 refills | Status: DC

## 2016-08-21 NOTE — Progress Notes (Signed)
Chief Complaint  Patient presents with  . Follow-up   History of Present Illness: 71 yo male with history of HTN, DM, palpitations, PVCs who is here today for cardiac follow up. He was seen in our office 06/28/15 by Almyra Deforest, PA for palpitations. EKG with sinus tachycardia. TSH normal. Echo December 2016 with normal LV systolic function, LVEF [id number], no valve disease. 24 hour cardiac monitor with sinus rhythm, rare PVCs, frequent PACs. He was started on a beta blocker.   He is here today for follow up. He is feeling much better. Rare palpitations. NO chest pain or SOB.   Primary Care Physician: Walker Kehr, MD   Past Medical History:  Diagnosis Date  . Hypertension   . Vertigo     Past Surgical History:  Procedure Laterality Date  . NO PAST SURGERIES      Current Outpatient Prescriptions  Medication Sig Dispense Refill  . amLODipine (NORVASC) 5 MG tablet Take 1 tablet (5 mg total) by mouth daily. 90 tablet 3  . aspirin 81 MG tablet Take 81 mg by mouth daily.    . cholecalciferol (VITAMIN D) 1000 units tablet Take 1 tablet (1,000 Units total) by mouth daily. 100 tablet 3  . ibuprofen (ADVIL,MOTRIN) 600 MG tablet take 1 tablet by mouth twice a day if needed for FOR MODERATE PAIN 60 tablet 3  . losartan (COZAAR) 100 MG tablet Take 1 tablet (100 mg total) by mouth daily. 90 tablet 3  . metoprolol tartrate (LOPRESSOR) 25 MG tablet Take 1 tablet (25 mg total) by mouth 2 (two) times daily. 180 tablet 3  . Multiple Vitamin (MULTIVITAMIN) tablet Take 1 tablet by mouth daily.    . traMADol (ULTRAM) 50 MG tablet take 1-2 tablets by mouth twice a day if needed 120 tablet 2   No current facility-administered medications for this visit.     Allergies  Allergen Reactions  . Codeine Nausea Only    Social History   Social History  . Marital status: Married    Spouse name: N/A  . Number of children: N/A  . Years of education: N/A   Occupational History  . Not on file.    Social History Main Topics  . Smoking status: Former Research scientist (life sciences)  . Smokeless tobacco: Never Used     Comment: quit 1975  . Alcohol use No     Comment: quit 2002  . Drug use: No  . Sexual activity: Yes   Other Topics Concern  . Not on file   Social History Narrative  . No narrative on file    Family History  Problem Relation Age of Onset  . Alzheimer's disease Mother   . Cancer Father 73    bladder ca  . Diabetes Father     Review of Systems:  As stated in the HPI and otherwise negative.   BP (!) 156/80   Pulse 74   Ht 5' 10.5" (1.791 m)   Wt 203 lb 6.4 oz (92.3 kg)   BMI 28.77 kg/m   Physical Examination: General: Well developed, well nourished, NAD  HEENT: OP clear, mucus membranes moist  SKIN: warm, dry. No rashes. Neuro: No focal deficits  Musculoskeletal: Muscle strength 5/5 all ext  Psychiatric: Mood and affect normal  Neck: No JVD, no carotid bruits, no thyromegaly, no lymphadenopathy.  Lungs:Clear bilaterally, no wheezes, rhonci, crackles Cardiovascular: Regular rate and rhythm. No murmurs, gallops or rubs. Abdomen:Soft. Bowel sounds present. Non-tender.  Extremities: No lower extremity edema. Pulses are  2 + in the bilateral DP/PT.  EKG:  EKG is ordered today. The ekg ordered today demonstrates NSR, rate 74 bpm  Recent Labs: 10/09/2015: ALT 15; BUN 24; Creatinine, Ser 0.88; Hemoglobin 14.7; Platelets 202.0; Potassium 4.5; Sodium 141; TSH 1.10   Lipid Panel    Component Value Date/Time   CHOL 165 10/09/2015 0820   TRIG 68.0 10/09/2015 0820   HDL 42.60 10/09/2015 0820   CHOLHDL 4 10/09/2015 0820   VLDL 13.6 10/09/2015 0820   LDLCALC 109 (H) 10/09/2015 0820     Wt Readings from Last 3 Encounters:  08/21/16 203 lb 6.4 oz (92.3 kg)  04/07/16 200 lb (90.7 kg)  10/10/15 196 lb (88.9 kg)     Other studies Reviewed: Additional studies/ records that were reviewed today include: . Review of the above records demonstrates:   Assessment and Plan:    1. Palpitations: He is known to have PACs and PVCs by cardiac monitor in January 2017. He has tolerated the metoprolol well. Rare palpitations. Echo December 2016 with normal LV function, no valve disease. Will continue Lopressor 25 mg po BID  Current medicines are reviewed at length with the patient today.  The patient does not have concerns regarding medicines.  The following changes have been made:  no change  Labs/ tests ordered today include:  No orders of the defined types were placed in this encounter.    Disposition:   FU with me in 12 months   Signed, Lauree Chandler, MD 08/21/2016 9:00 AM    [address], [place], [place]  [address] Phone: [phone]; Fax: [phone]

## 2016-08-21 NOTE — Patient Instructions (Signed)

## 2016-09-05 ENCOUNTER — Other Ambulatory Visit: Payer: Self-pay | Admitting: Internal Medicine

## 2016-09-28 ENCOUNTER — Telehealth: Payer: Self-pay | Admitting: Internal Medicine

## 2016-09-28 NOTE — Telephone Encounter (Signed)
Spoke with patient regarding AWV. Patient stated that he does not want to schedule wellness visit for this year.

## 2016-10-06 ENCOUNTER — Ambulatory Visit (INDEPENDENT_AMBULATORY_CARE_PROVIDER_SITE_OTHER): Payer: Medicare Other | Admitting: Internal Medicine

## 2016-10-06 ENCOUNTER — Encounter: Payer: Self-pay | Admitting: Internal Medicine

## 2016-10-06 ENCOUNTER — Other Ambulatory Visit (INDEPENDENT_AMBULATORY_CARE_PROVIDER_SITE_OTHER): Payer: Medicare Other

## 2016-10-06 VITALS — BP 120/60 | HR 65 | Temp 97.7°F | Ht 70.5 in | Wt 202.0 lb

## 2016-10-06 DIAGNOSIS — R972 Elevated prostate specific antigen [PSA]: Secondary | ICD-10-CM

## 2016-10-06 DIAGNOSIS — I10 Essential (primary) hypertension: Secondary | ICD-10-CM | POA: Diagnosis not present

## 2016-10-06 DIAGNOSIS — R739 Hyperglycemia, unspecified: Secondary | ICD-10-CM

## 2016-10-06 DIAGNOSIS — E785 Hyperlipidemia, unspecified: Secondary | ICD-10-CM | POA: Diagnosis not present

## 2016-10-06 DIAGNOSIS — M5440 Lumbago with sciatica, unspecified side: Secondary | ICD-10-CM | POA: Diagnosis not present

## 2016-10-06 LAB — HEPATIC FUNCTION PANEL
ALK PHOS: 69 U/L (ref 39–117)
ALT: 20 U/L (ref 0–53)
AST: 20 U/L (ref 0–37)
Albumin: 4.5 g/dL (ref 3.5–5.2)
BILIRUBIN DIRECT: 0.2 mg/dL (ref 0.0–0.3)
BILIRUBIN TOTAL: 0.8 mg/dL (ref 0.2–1.2)
Total Protein: 6.9 g/dL (ref 6.0–8.3)

## 2016-10-06 LAB — CBC WITH DIFFERENTIAL/PLATELET
BASOS ABS: 0 10*3/uL (ref 0.0–0.1)
Basophils Relative: 0.7 % (ref 0.0–3.0)
EOS ABS: 0.2 10*3/uL (ref 0.0–0.7)
Eosinophils Relative: 2.7 % (ref 0.0–5.0)
HCT: 42.7 % (ref 39.0–52.0)
Hemoglobin: 14.9 g/dL (ref 13.0–17.0)
LYMPHS ABS: 1.7 10*3/uL (ref 0.7–4.0)
Lymphocytes Relative: 25.3 % (ref 12.0–46.0)
MCHC: 34.9 g/dL (ref 30.0–36.0)
MCV: 100.3 fl — ABNORMAL HIGH (ref 78.0–100.0)
MONO ABS: 0.6 10*3/uL (ref 0.1–1.0)
MONOS PCT: 9 % (ref 3.0–12.0)
NEUTROS ABS: 4.2 10*3/uL (ref 1.4–7.7)
NEUTROS PCT: 62.3 % (ref 43.0–77.0)
PLATELETS: 197 10*3/uL (ref 150.0–400.0)
RBC: 4.26 Mil/uL (ref 4.22–5.81)
RDW: 12.7 % (ref 11.5–15.5)
WBC: 6.7 10*3/uL (ref 4.0–10.5)

## 2016-10-06 LAB — PSA: PSA: 2.75 ng/mL (ref 0.10–4.00)

## 2016-10-06 LAB — URINALYSIS
Bilirubin Urine: NEGATIVE
HGB URINE DIPSTICK: NEGATIVE
KETONES UR: NEGATIVE
Leukocytes, UA: NEGATIVE
Nitrite: NEGATIVE
PH: 6 (ref 5.0–8.0)
SPECIFIC GRAVITY, URINE: 1.025 (ref 1.000–1.030)
Total Protein, Urine: NEGATIVE
URINE GLUCOSE: NEGATIVE
Urobilinogen, UA: 0.2 (ref 0.0–1.0)

## 2016-10-06 LAB — BASIC METABOLIC PANEL
BUN: 18 mg/dL (ref 6–23)
CALCIUM: 9.7 mg/dL (ref 8.4–10.5)
CO2: 26 mEq/L (ref 19–32)
Chloride: 105 mEq/L (ref 96–112)
Creatinine, Ser: 0.84 mg/dL (ref 0.40–1.50)
GFR: 115.98 mL/min (ref >60.00)
GLUCOSE: 94 mg/dL (ref 70–99)
Potassium: 4.1 mEq/L (ref 3.5–5.1)
Sodium: 139 mEq/L (ref 135–145)

## 2016-10-06 LAB — LIPID PANEL
Cholesterol: 168 mg/dL (ref 0–200)
HDL: 41.8 mg/dL (ref >39.00)
LDL CALC: 110 mg/dL — AB (ref 0–99)
NONHDL: 126.41
Total CHOL/HDL Ratio: 4
Triglycerides: 82 mg/dL (ref 0.0–149.0)
VLDL: 16.4 mg/dL (ref 0.0–40.0)

## 2016-10-06 LAB — HEMOGLOBIN A1C: HEMOGLOBIN A1C: 4.8 % (ref 4.6–6.5)

## 2016-10-06 LAB — TSH: TSH: 1.03 u[IU]/mL (ref 0.35–4.50)

## 2016-10-06 NOTE — Assessment & Plan Note (Signed)
Tramadol prn ° Potential benefits of a long term opioids use as well as potential risks (i.e. addiction risk, apnea etc) and complications (i.e. Somnolence, constipation and others) were explained to the patient and were aknowledged. ° ° °

## 2016-10-06 NOTE — Assessment & Plan Note (Signed)
Lopressor, Norvasc 

## 2016-10-06 NOTE — Assessment & Plan Note (Signed)
Labs

## 2016-10-06 NOTE — Progress Notes (Signed)
Subjective:  Patient ID: Patrick Morgan, male    DOB: Mar 05, 1946  Age: 71 y.o. MRN: [id number]  CC: No chief complaint on file.   HPI KRISTAPHER DUBUQUE presents for a well exam f/u  Outpatient Medications Prior to Visit  Medication Sig Dispense Refill  . amLODipine (NORVASC) 5 MG tablet Take 1 tablet (5 mg total) by mouth daily. Yearly physical due in April must see Md for future refills 30 tablet 0  . aspirin 81 MG tablet Take 81 mg by mouth daily.    . cholecalciferol (VITAMIN D) 1000 units tablet Take 1 tablet (1,000 Units total) by mouth daily. 100 tablet 3  . ibuprofen (ADVIL,MOTRIN) 600 MG tablet take 1 tablet by mouth twice a day if needed for FOR MODERATE PAIN 60 tablet 3  . losartan (COZAAR) 100 MG tablet Take 1 tablet (100 mg total) by mouth daily. 90 tablet 3  . metoprolol tartrate (LOPRESSOR) 25 MG tablet Take 1 tablet (25 mg total) by mouth 2 (two) times daily. 180 tablet 3  . Multiple Vitamin (MULTIVITAMIN) tablet Take 1 tablet by mouth daily.    . traMADol (ULTRAM) 50 MG tablet take 1-2 tablets by mouth twice a day if needed 120 tablet 2   No facility-administered medications prior to visit.     ROS Review of Systems  Constitutional: Negative for appetite change, fatigue and unexpected weight change.  HENT: Negative for congestion, nosebleeds, sneezing, sore throat and trouble swallowing.   Eyes: Negative for itching and visual disturbance.  Respiratory: Negative for cough.   Cardiovascular: Negative for chest pain, palpitations and leg swelling.  Gastrointestinal: Negative for abdominal distention, blood in stool, diarrhea and nausea.  Genitourinary: Negative for frequency and hematuria.  Musculoskeletal: Positive for back pain. Negative for gait problem, joint swelling and neck pain.  Skin: Negative for rash.  Neurological: Negative for dizziness, tremors, speech difficulty and weakness.  Psychiatric/Behavioral: Negative for agitation, dysphoric mood and sleep  disturbance. The patient is not nervous/anxious.     Objective:  BP 120/60 (BP Location: Left Arm, Patient Position: Sitting, Cuff Size: Large)   Pulse 65   Temp 97.7 F (36.5 C) (Oral)   Ht 5' 10.5" (1.791 m)   Wt 202 lb (91.6 kg)   SpO2 100%   BMI 28.57 kg/m   BP Readings from Last 3 Encounters:  10/06/16 120/60  08/21/16 (!) 156/80  04/07/16 122/62    Wt Readings from Last 3 Encounters:  10/06/16 202 lb (91.6 kg)  08/21/16 203 lb 6.4 oz (92.3 kg)  04/07/16 200 lb (90.7 kg)    Physical Exam  Constitutional: He is oriented to person, place, and time. He appears well-developed and well-nourished. No distress.  HENT:  Head: Normocephalic and atraumatic.  Right Ear: External ear normal.  Left Ear: External ear normal.  Nose: Nose normal.  Mouth/Throat: Oropharynx is clear and moist. No oropharyngeal exudate.  Eyes: Conjunctivae and EOM are normal. Pupils are equal, round, and reactive to light. Right eye exhibits no discharge. Left eye exhibits no discharge. No scleral icterus.  Neck: Normal range of motion. Neck supple. No JVD present. No tracheal deviation present. No thyromegaly present.  Cardiovascular: Normal rate, regular rhythm, normal heart sounds and intact distal pulses.  Exam reveals no gallop and no friction rub.   No murmur heard. Pulmonary/Chest: Effort normal and breath sounds normal. No stridor. No respiratory distress. He has no wheezes. He has no rales. He exhibits no tenderness.  Abdominal: Soft. Bowel sounds  are normal. He exhibits no distension and no mass. There is no tenderness. There is no rebound and no guarding.  Musculoskeletal: Normal range of motion. He exhibits no edema or tenderness.  Lymphadenopathy:    He has no cervical adenopathy.  Neurological: He is alert and oriented to person, place, and time. He has normal reflexes. No cranial nerve deficit. He exhibits normal muscle tone. Coordination normal.  Skin: Skin is warm and dry. No rash  noted. He is not diaphoretic. No erythema. No pallor.  Psychiatric: He has a normal mood and affect. His behavior is normal. Judgment and thought content normal.   Recent rectal per dr Collene Mares   Lab Results  Component Value Date   WBC 7.0 10/09/2015   HGB 14.7 10/09/2015   HCT 43.1 10/09/2015   PLT 202.0 10/09/2015   GLUCOSE 100 (H) 10/09/2015   CHOL 165 10/09/2015   TRIG 68.0 10/09/2015   HDL 42.60 10/09/2015   LDLCALC 109 (H) 10/09/2015   ALT 15 10/09/2015   AST 16 10/09/2015   NA 141 10/09/2015   K 4.5 10/09/2015   CL 106 10/09/2015   CREATININE 0.88 10/09/2015   BUN 24 (H) 10/09/2015   CO2 29 10/09/2015   TSH 1.10 10/09/2015   PSA 2.44 10/09/2015   HGBA1C 4.8 10/09/2015    Dg Chest 2 View  Addendum Date: 06/08/2015   ADDENDUM REPORT: 06/08/2015 10:30 ADDENDUM: Impression should read:  No edema or consolidation. Electronically Signed   By: Lowella Grip III M.D.   On: 06/08/2015 10:30  Result Date: 06/08/2015 CLINICAL DATA:  Cardiac palpitations for 2 weeks.  Hypertension. EXAM: CHEST  2 VIEW COMPARISON:  None. FINDINGS: Lungs are clear. Heart size and pulmonary vascularity are normal. No adenopathy. There is degenerative change thoracic spine. IMPRESSION: Edema or consolidation. Electronically Signed: By: Lowella Grip III M.D. On: 06/08/2015 10:26    Assessment & Plan:   There are no diagnoses linked to this encounter. I am having Mr. Ratledge maintain his aspirin, multivitamin, losartan, traMADol, cholecalciferol, ibuprofen, metoprolol tartrate, and amLODipine.  No orders of the defined types were placed in this encounter.    Follow-up: No Follow-up on file.  Walker Kehr, MD

## 2016-10-06 NOTE — Progress Notes (Signed)
Pre visit review using our clinic review tool, if applicable. No additional management support is needed unless otherwise documented below in the visit note. 

## 2016-10-06 NOTE — Assessment & Plan Note (Signed)
A1c

## 2016-10-17 ENCOUNTER — Other Ambulatory Visit: Payer: Self-pay | Admitting: Internal Medicine

## 2017-01-02 ENCOUNTER — Other Ambulatory Visit: Payer: Self-pay | Admitting: Internal Medicine

## 2017-01-04 NOTE — Telephone Encounter (Signed)
Routing to dr plotnikov, please advise, thanks 

## 2017-01-05 NOTE — Telephone Encounter (Addendum)
Called in to pharm, left voicemail--- patient advised

## 2017-02-17 IMAGING — DX DG CHEST 2V
2 series · 2 of 2 positions shown · non-contrast
Comparison: None.

ADDENDUM:
Impression should read:  No edema or consolidation.
CLINICAL DATA: Cardiac palpitations for 2 weeks.  Hypertension.

EXAM:
CHEST  2 VIEW

[chest pa]
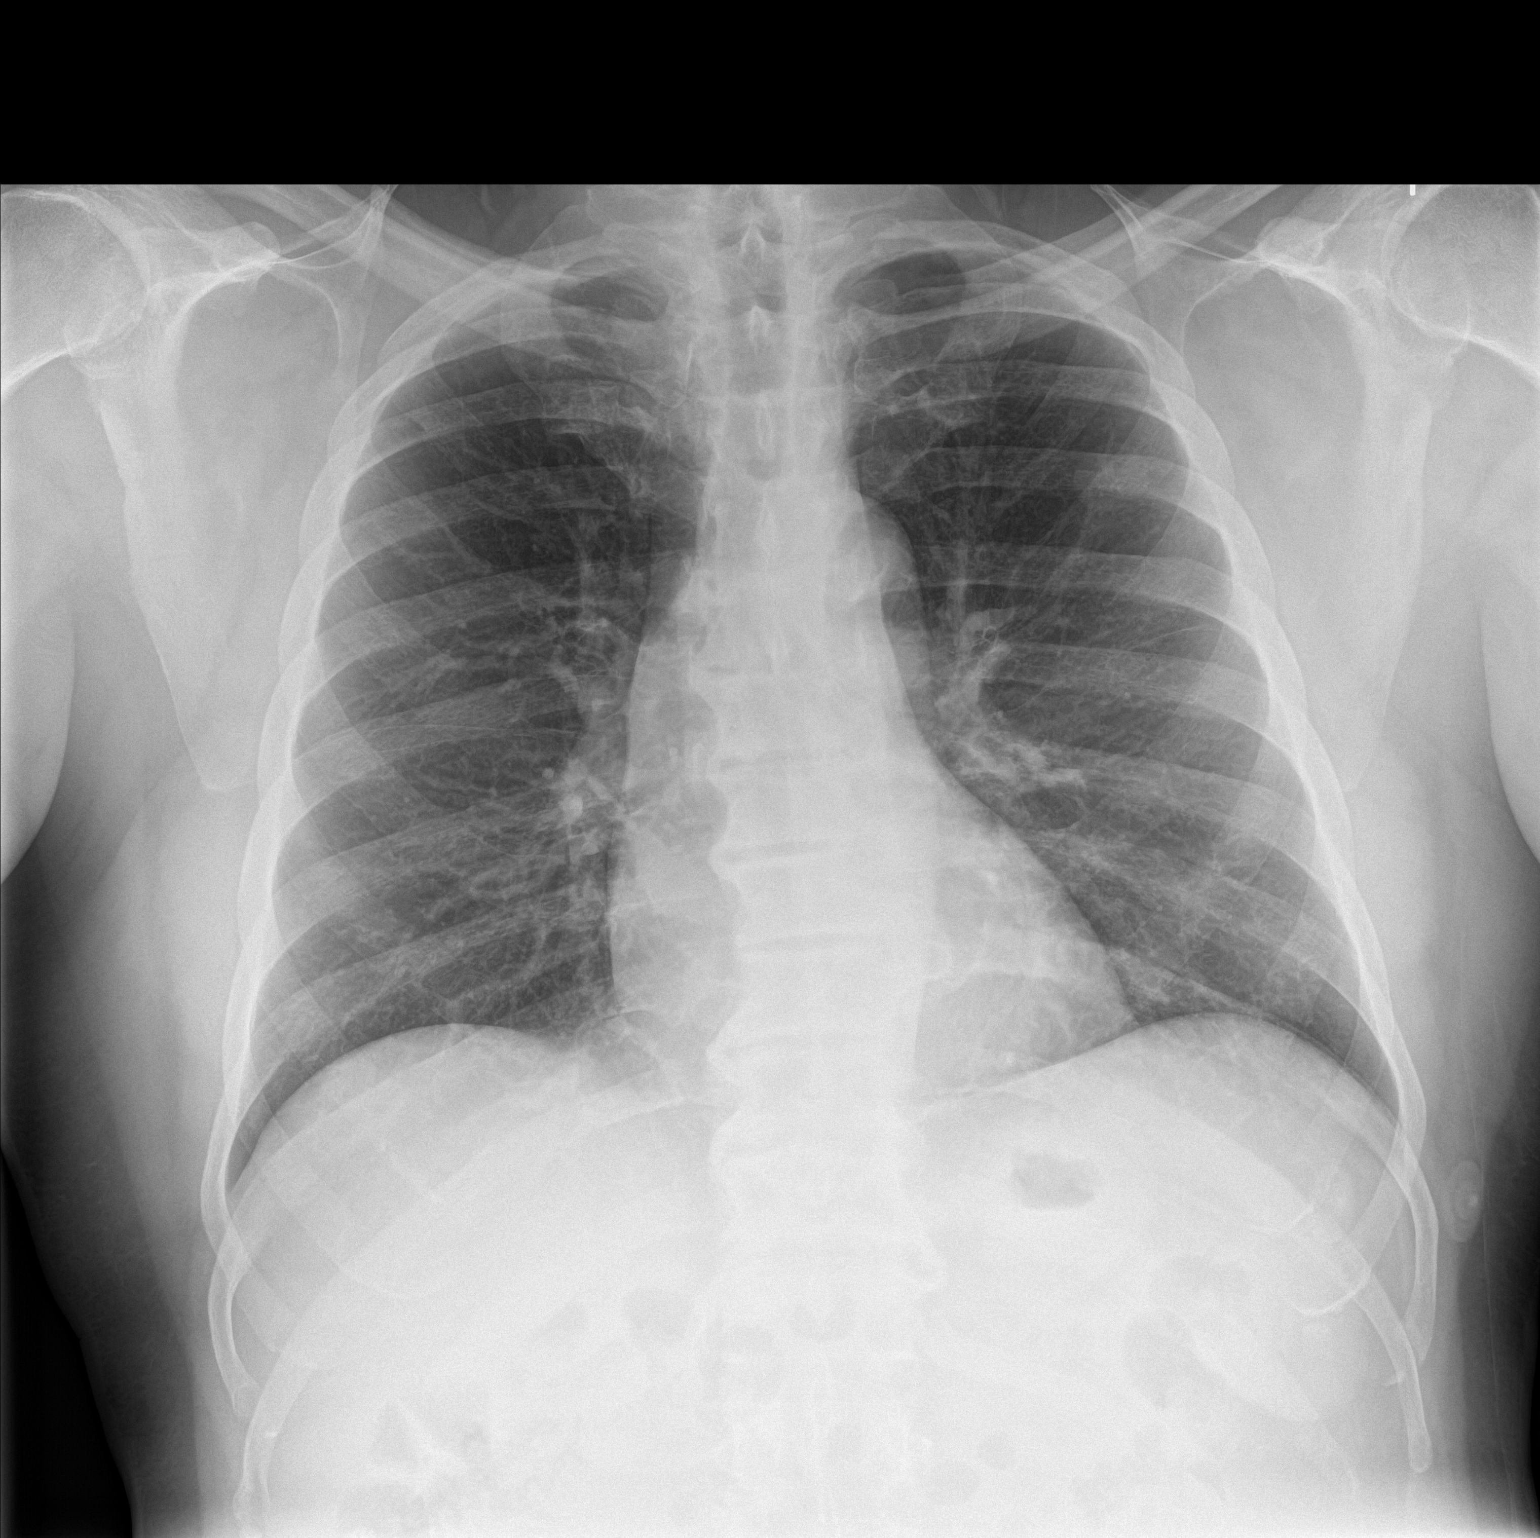

[chest lat]
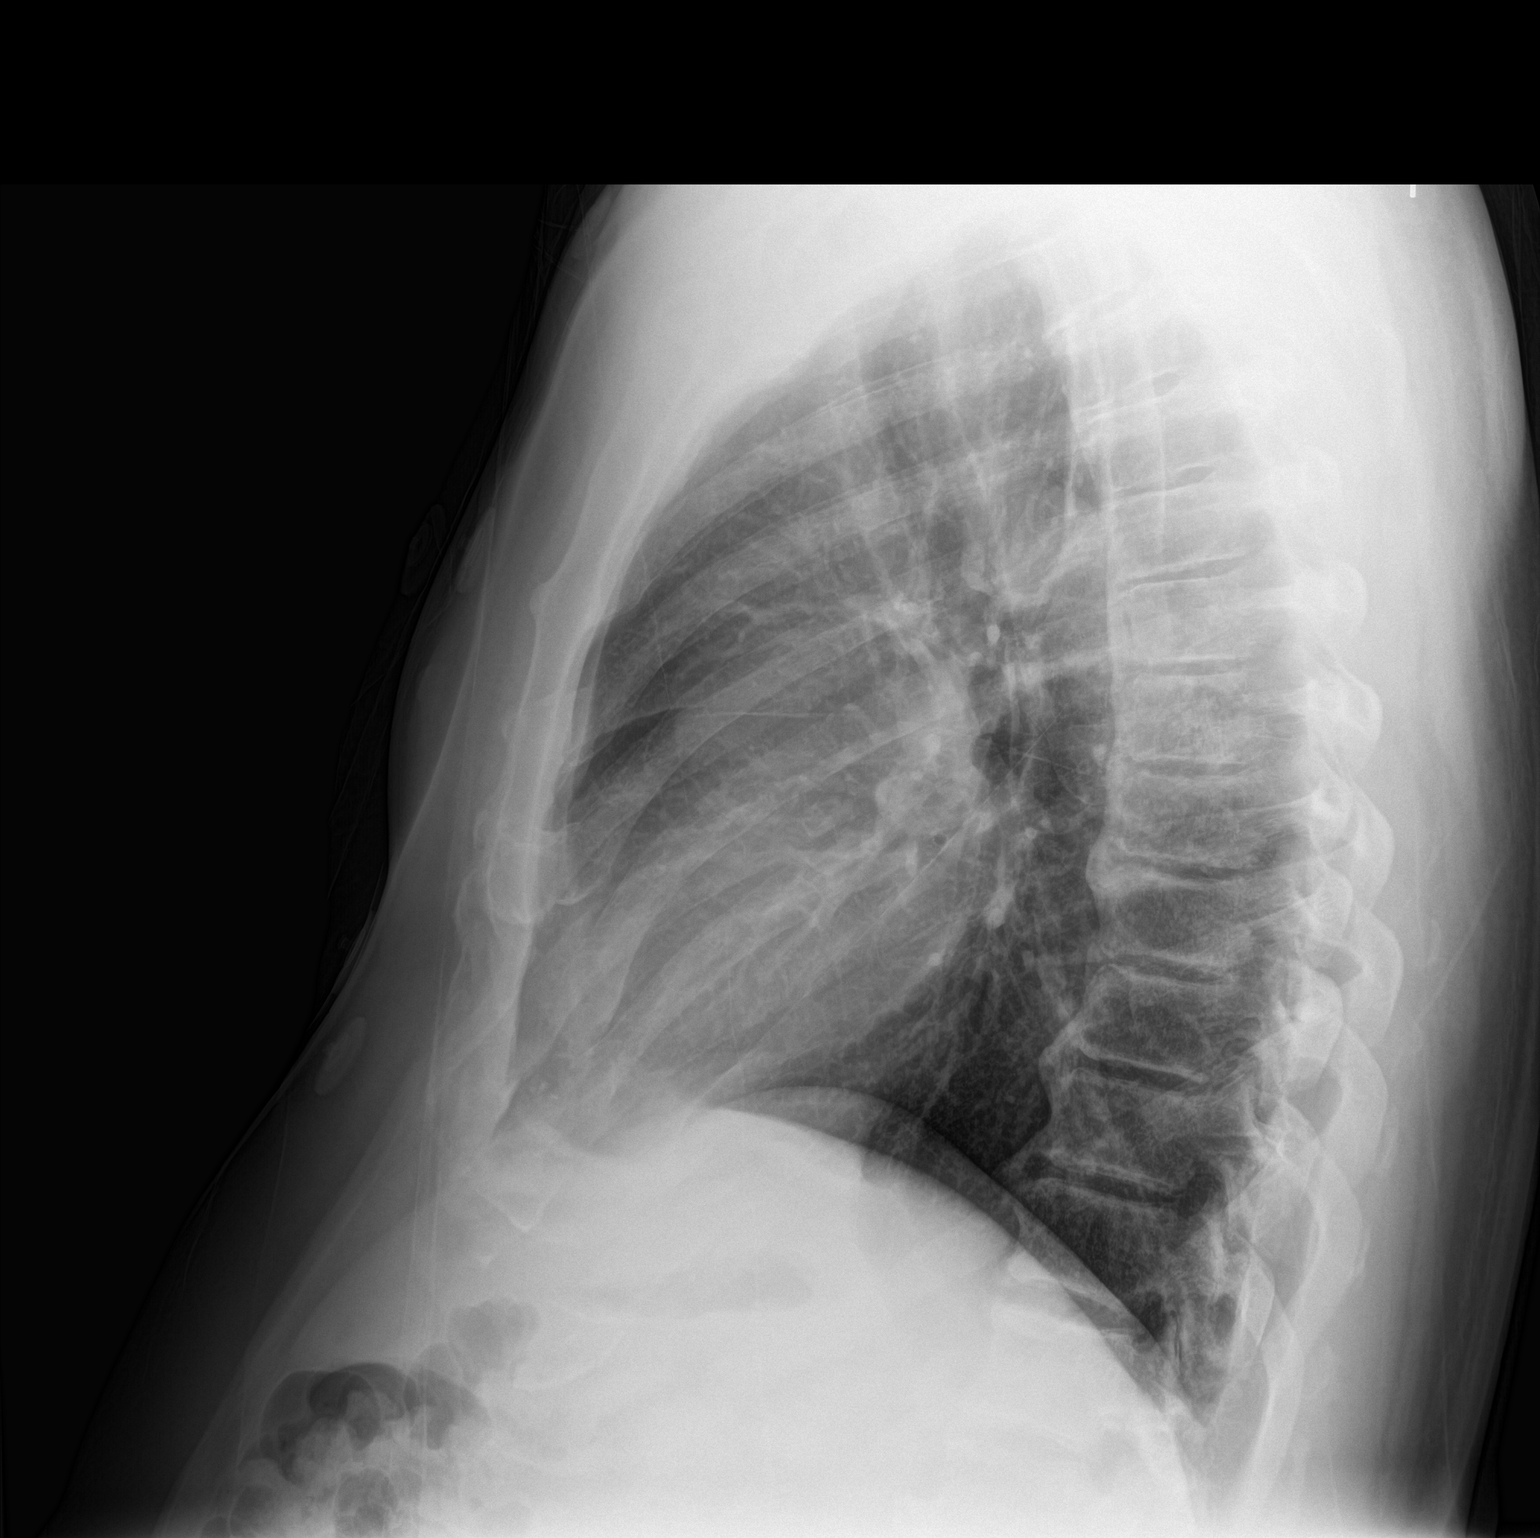

[2 of 2 positions shown; findings below may reference images not displayed]

FINDINGS: Lungs are clear. Heart size and pulmonary vascularity are normal. No
adenopathy. There is degenerative change thoracic spine.
IMPRESSION: Edema or consolidation.

## 2017-03-21 ENCOUNTER — Other Ambulatory Visit: Payer: Self-pay | Admitting: Internal Medicine

## 2017-03-23 DIAGNOSIS — Z23 Encounter for immunization: Secondary | ICD-10-CM | POA: Diagnosis not present

## 2017-04-07 ENCOUNTER — Other Ambulatory Visit (INDEPENDENT_AMBULATORY_CARE_PROVIDER_SITE_OTHER): Payer: Medicare Other

## 2017-04-07 ENCOUNTER — Encounter: Payer: Self-pay | Admitting: Internal Medicine

## 2017-04-07 ENCOUNTER — Ambulatory Visit (INDEPENDENT_AMBULATORY_CARE_PROVIDER_SITE_OTHER): Payer: Medicare Other | Admitting: Internal Medicine

## 2017-04-07 DIAGNOSIS — M5440 Lumbago with sciatica, unspecified side: Secondary | ICD-10-CM | POA: Diagnosis not present

## 2017-04-07 DIAGNOSIS — R972 Elevated prostate specific antigen [PSA]: Secondary | ICD-10-CM

## 2017-04-07 DIAGNOSIS — R739 Hyperglycemia, unspecified: Secondary | ICD-10-CM

## 2017-04-07 DIAGNOSIS — I10 Essential (primary) hypertension: Secondary | ICD-10-CM

## 2017-04-07 LAB — BASIC METABOLIC PANEL
BUN: 17 mg/dL (ref 6–23)
CHLORIDE: 103 meq/L (ref 96–112)
CO2: 28 meq/L (ref 19–32)
Calcium: 9.8 mg/dL (ref 8.4–10.5)
Creatinine, Ser: 0.88 mg/dL (ref 0.40–1.50)
GFR: 109.76 mL/min (ref >60.00)
GLUCOSE: 107 mg/dL — AB (ref 70–99)
POTASSIUM: 5.1 meq/L (ref 3.5–5.1)
Sodium: 139 mEq/L (ref 135–145)

## 2017-04-07 LAB — HEMOGLOBIN A1C: Hgb A1c MFr Bld: 4.8 % (ref 4.6–6.5)

## 2017-04-07 LAB — PSA: PSA: 2.83 ng/mL (ref 0.10–4.00)

## 2017-04-07 MED ORDER — TRAMADOL HCL 50 MG PO TABS: ORAL_TABLET | ORAL | 2 refills | Status: DC

## 2017-04-07 MED ORDER — LOSARTAN POTASSIUM 100 MG PO TABS: 100.0000 mg | ORAL_TABLET | Freq: Every day | ORAL | 3 refills | Status: DC

## 2017-04-07 MED ORDER — AMLODIPINE BESYLATE 5 MG PO TABS: 5.0000 mg | ORAL_TABLET | Freq: Every day | ORAL | 3 refills | Status: DC

## 2017-04-07 NOTE — Assessment & Plan Note (Signed)
Tramadol prn ° Potential benefits of a long term opioids use as well as potential risks (i.e. addiction risk, apnea etc) and complications (i.e. Somnolence, constipation and others) were explained to the patient and were aknowledged. ° ° °

## 2017-04-07 NOTE — Assessment & Plan Note (Signed)
Labs

## 2017-04-07 NOTE — Assessment & Plan Note (Signed)
Lopressor, Norvasc NAS diet

## 2017-04-07 NOTE — Assessment & Plan Note (Signed)
PSA

## 2017-04-07 NOTE — Progress Notes (Addendum)
Subjective:  Patient ID: Patrick Morgan, male    DOB: Dec 11, 1945  Age: 71 y.o. MRN: [id number]  CC: No chief complaint on file.   HPI SAMMY CASSAR presents for HTN, LBP, elevated PSA f/u  Outpatient Medications Prior to Visit  Medication Sig Dispense Refill  . amLODipine (NORVASC) 5 MG tablet Take 1 tablet (5 mg total) by mouth daily. 90 tablet 1  . aspirin 81 MG tablet Take 81 mg by mouth daily.    . cholecalciferol (VITAMIN D) 1000 units tablet Take 1 tablet (1,000 Units total) by mouth daily. 100 tablet 3  . ibuprofen (IBU) 600 MG tablet Take 1 tablet (600 mg total) by mouth 2 (two) times daily. Annual appt due in Oct must see provider for future refills 60 tablet 0  . losartan (COZAAR) 100 MG tablet take 1 tablet by mouth once daily 90 tablet 1  . metoprolol tartrate (LOPRESSOR) 25 MG tablet Take 1 tablet (25 mg total) by mouth 2 (two) times daily. 180 tablet 3  . Multiple Vitamin (MULTIVITAMIN) tablet Take 1 tablet by mouth daily.    . traMADol (ULTRAM) 50 MG tablet take 1-2 tablets by mouth twice a day if needed 120 tablet 2   No facility-administered medications prior to visit.     ROS Review of Systems  Constitutional: Negative for appetite change, fatigue and unexpected weight change.  HENT: Negative for congestion, nosebleeds, sneezing, sore throat and trouble swallowing.   Eyes: Negative for itching and visual disturbance.  Respiratory: Negative for cough.   Cardiovascular: Negative for chest pain, palpitations and leg swelling.  Gastrointestinal: Negative for abdominal distention, blood in stool, diarrhea and nausea.  Genitourinary: Negative for frequency and hematuria.  Musculoskeletal: Positive for back pain. Negative for gait problem, joint swelling and neck pain.  Skin: Negative for rash.  Neurological: Negative for dizziness, tremors, speech difficulty and weakness.  Psychiatric/Behavioral: Negative for agitation, dysphoric mood and sleep disturbance. The  patient is not nervous/anxious.     Objective:  BP 140/72 (BP Location: Left Arm, Patient Position: Sitting, Cuff Size: Large)   Pulse 65   Temp 98.4 F (36.9 C) (Oral)   Ht 5' 10.5" (1.791 m)   Wt 205 lb (93 kg)   SpO2 98%   BMI 29.00 kg/m   BP Readings from Last 3 Encounters:  04/07/17 140/72  10/06/16 120/60  08/21/16 (!) 156/80    Wt Readings from Last 3 Encounters:  04/07/17 205 lb (93 kg)  10/06/16 202 lb (91.6 kg)  08/21/16 203 lb 6.4 oz (92.3 kg)    Physical Exam  Constitutional: He is oriented to person, place, and time. He appears well-developed. No distress.  NAD  HENT:  Mouth/Throat: Oropharynx is clear and moist.  Eyes: Pupils are equal, round, and reactive to light. Conjunctivae are normal.  Neck: Normal range of motion. No JVD present. No thyromegaly present.  Cardiovascular: Normal rate, regular rhythm, normal heart sounds and intact distal pulses.  Exam reveals no gallop and no friction rub.   No murmur heard. Pulmonary/Chest: Effort normal and breath sounds normal. No respiratory distress. He has no wheezes. He has no rales. He exhibits no tenderness.  Abdominal: Soft. Bowel sounds are normal. He exhibits no distension and no mass. There is no tenderness. There is no rebound and no guarding.  Musculoskeletal: Normal range of motion. He exhibits no edema or tenderness.  Lymphadenopathy:    He has no cervical adenopathy.  Neurological: He is alert and oriented to  person, place, and time. He has normal reflexes. No cranial nerve deficit. He exhibits normal muscle tone. He displays a negative Romberg sign. Coordination and gait normal.  Skin: Skin is warm and dry. No rash noted.  Psychiatric: He has a normal mood and affect. His behavior is normal. Judgment and thought content normal.    Lab Results  Component Value Date   WBC 6.7 10/06/2016   HGB 14.9 10/06/2016   HCT 42.7 10/06/2016   PLT 197.0 10/06/2016   GLUCOSE 94 10/06/2016   CHOL 168  10/06/2016   TRIG 82.0 10/06/2016   HDL 41.80 10/06/2016   LDLCALC 110 (H) 10/06/2016   ALT 20 10/06/2016   AST 20 10/06/2016   NA 139 10/06/2016   K 4.1 10/06/2016   CL 105 10/06/2016   CREATININE 0.84 10/06/2016   BUN 18 10/06/2016   CO2 26 10/06/2016   TSH 1.03 10/06/2016   PSA 2.75 10/06/2016   HGBA1C 4.8 10/06/2016    Dg Chest 2 View  Addendum Date: 06/08/2015   ADDENDUM REPORT: 06/08/2015 10:30 ADDENDUM: Impression should read:  No edema or consolidation. Electronically Signed   By: Lowella Grip III M.D.   On: 06/08/2015 10:30  Result Date: 06/08/2015 CLINICAL DATA:  Cardiac palpitations for 2 weeks.  Hypertension. EXAM: CHEST  2 VIEW COMPARISON:  None. FINDINGS: Lungs are clear. Heart size and pulmonary vascularity are normal. No adenopathy. There is degenerative change thoracic spine. IMPRESSION: Edema or consolidation. Electronically Signed: By: Lowella Grip III M.D. On: 06/08/2015 10:26    Assessment & Plan:   There are no diagnoses linked to this encounter. I am having Mr. Gironda maintain his aspirin, multivitamin, cholecalciferol, metoprolol tartrate, losartan, amLODipine, traMADol, and ibuprofen.  No orders of the defined types were placed in this encounter.    Follow-up: No Follow-up on file.  Walker Kehr, MD

## 2017-04-07 NOTE — Patient Instructions (Signed)
MC well w/Jill 

## 2017-04-17 ENCOUNTER — Other Ambulatory Visit: Payer: Self-pay | Admitting: Internal Medicine

## 2017-05-23 ENCOUNTER — Other Ambulatory Visit: Payer: Self-pay | Admitting: Internal Medicine

## 2017-07-26 ENCOUNTER — Other Ambulatory Visit: Payer: Self-pay | Admitting: Internal Medicine

## 2017-09-08 ENCOUNTER — Encounter: Payer: Self-pay | Admitting: Cardiovascular Disease

## 2017-09-08 ENCOUNTER — Ambulatory Visit (INDEPENDENT_AMBULATORY_CARE_PROVIDER_SITE_OTHER): Payer: Medicare Other | Admitting: Cardiovascular Disease

## 2017-09-08 VITALS — BP 162/80 | HR 76 | Ht 70.5 in | Wt 208.0 lb

## 2017-09-08 DIAGNOSIS — I493 Ventricular premature depolarization: Secondary | ICD-10-CM

## 2017-09-08 MED ORDER — METOPROLOL TARTRATE 25 MG PO TABS: 25.0000 mg | ORAL_TABLET | Freq: Two times a day (BID) | ORAL | 3 refills | Status: DC

## 2017-09-08 NOTE — Patient Instructions (Signed)

## 2017-09-08 NOTE — Progress Notes (Signed)
Chief Complaint  Patient presents with  . Follow-up    PVCs   History of Present Illness: 72 yo male with history of HTN, palpitations and PVCs who is here today for cardiac follow up. He was seen in our office 06/28/15 by Almyra Deforest, PA for palpitations. EKG with sinus tachycardia. TSH normal. Echo December 2016 with normal LV systolic function, LVEF 12-45%, no valve disease. 24 hour cardiac monitor with sinus rhythm, rare PVCs, frequent PACs. He was started on a beta blocker.   He is here today for follow up. The patient denies any chest pain, dyspnea, palpitations, lower extremity edema, orthopnea, PND, dizziness, near syncope or syncope.   Primary Care Physician: Cassandria Anger, MD  Past Medical History:  Diagnosis Date  . Hypertension   . PAC (premature atrial contraction)   . Vertigo     Past Surgical History:  Procedure Laterality Date  . NO PAST SURGERIES      Current Outpatient Medications  Medication Sig Dispense Refill  . amLODipine (NORVASC) 5 MG tablet Take 1 tablet (5 mg total) by mouth daily. 90 tablet 3  . amLODipine (NORVASC) 5 MG tablet take 1 tablet by mouth once daily 90 tablet 3  . aspirin 81 MG tablet Take 81 mg by mouth daily.    . IBU 600 MG tablet take 1 tablet by mouth twice a day 60 tablet 0  . losartan (COZAAR) 100 MG tablet Take 1 tablet (100 mg total) by mouth daily. 90 tablet 3  . losartan (COZAAR) 100 MG tablet take 1 tablet by mouth once daily 90 tablet 3  . metoprolol tartrate (LOPRESSOR) 25 MG tablet Take 1 tablet (25 mg total) by mouth 2 (two) times daily. 180 tablet 3  . Multiple Vitamin (MULTIVITAMIN) tablet Take 1 tablet by mouth daily.    . traMADol (ULTRAM) 50 MG tablet take 1-2 tablets by mouth twice a day if needed 120 tablet 2   No current facility-administered medications for this visit.     Allergies  Allergen Reactions  . Codeine Nausea Only    Social History   Socioeconomic History  . Marital status: Married   Spouse name: Not on file  . Number of children: Not on file  . Years of education: Not on file  . Highest education level: Not on file  Social Needs  . Financial resource strain: Not on file  . Food insecurity - worry: Not on file  . Food insecurity - inability: Not on file  . Transportation needs - medical: Not on file  . Transportation needs - non-medical: Not on file  Occupational History  . Not on file  Tobacco Use  . Smoking status: Former Research scientist (life sciences)  . Smokeless tobacco: Never Used  . Tobacco comment: quit 1975  Substance and Sexual Activity  . Alcohol use: No    Alcohol/week: 0.0 oz    Comment: quit 2002  . Drug use: No  . Sexual activity: Yes  Other Topics Concern  . Not on file  Social History Narrative  . Not on file    Family History  Problem Relation Age of Onset  . Alzheimer's disease Mother   . Cancer Father 58       bladder ca  . Diabetes Father     Review of Systems:  As stated in the HPI and otherwise negative.   BP (!) 162/80   Pulse 76   Ht 5' 10.5" (1.791 m)   Wt 208 lb (94.3 kg)  SpO2 98%   BMI 29.42 kg/m   Physical Examination:  General: Well developed, well nourished, NAD  HEENT: OP clear, mucus membranes moist  SKIN: warm, dry. No rashes. Neuro: No focal deficits  Musculoskeletal: Muscle strength 5/5 all ext  Psychiatric: Mood and affect normal  Neck: No JVD, no carotid bruits, no thyromegaly, no lymphadenopathy.  Lungs:Clear bilaterally, no wheezes, rhonci, crackles Cardiovascular: Regular rate and rhythm. No murmurs, gallops or rubs. Abdomen:Soft. Bowel sounds present. Non-tender.  Extremities: No lower extremity edema. Pulses are 2 + in the bilateral DP/PT.  EKG:  EKG is  ordered today. The ekg ordered today demonstrates NSR, rate 76 bpm.   Recent Labs: 10/06/2016: ALT 20; Hemoglobin 14.9; Platelets 197.0; TSH 1.03 04/07/2017: BUN 17; Creatinine, Ser 0.88; Potassium 5.1; Sodium 139   Lipid Panel    Component Value Date/Time     CHOL 168 10/06/2016 1016   TRIG 82.0 10/06/2016 1016   HDL 41.80 10/06/2016 1016   CHOLHDL 4 10/06/2016 1016   VLDL 16.4 10/06/2016 1016   LDLCALC 110 (H) 10/06/2016 1016     Wt Readings from Last 3 Encounters:  09/08/17 208 lb (94.3 kg)  04/07/17 205 lb (93 kg)  10/06/16 202 lb (91.6 kg)     Other studies Reviewed: Additional studies/ records that were reviewed today include: . Review of the above records demonstrates:   Assessment and Plan:   1. Palpitations/PACs/PVCs: He is known to have PACs and PVCs by cardiac monitor in January 2017. Echo December 2016 with normal LV function, no valve disease. He is having no palpitations. Will continue his beta blocker.   2. HTN: BP has been well controlled at home.   Current medicines are reviewed at length with the patient today.  The patient does not have concerns regarding medicines.  The following changes have been made:  no change  Labs/ tests ordered today include:   Orders Placed This Encounter  Procedures  . EKG 12-Lead     Disposition:   FU with me in 12 months   Signed, Lauree Chandler, MD 09/08/2017 9:27 AM    [facility] [place], [place], [place]  [address] Phone: [phone]; Fax: [phone]

## 2017-10-07 ENCOUNTER — Other Ambulatory Visit (INDEPENDENT_AMBULATORY_CARE_PROVIDER_SITE_OTHER): Payer: Medicare Other

## 2017-10-07 ENCOUNTER — Encounter: Payer: Self-pay | Admitting: Internal Medicine

## 2017-10-07 ENCOUNTER — Ambulatory Visit (INDEPENDENT_AMBULATORY_CARE_PROVIDER_SITE_OTHER): Payer: Medicare Other | Admitting: Internal Medicine

## 2017-10-07 VITALS — BP 160/88 | HR 77 | Temp 98.0°F | Ht 70.5 in | Wt 211.0 lb

## 2017-10-07 DIAGNOSIS — R972 Elevated prostate specific antigen [PSA]: Secondary | ICD-10-CM | POA: Diagnosis not present

## 2017-10-07 DIAGNOSIS — N320 Bladder-neck obstruction: Secondary | ICD-10-CM

## 2017-10-07 DIAGNOSIS — R202 Paresthesia of skin: Secondary | ICD-10-CM | POA: Diagnosis not present

## 2017-10-07 DIAGNOSIS — I10 Essential (primary) hypertension: Secondary | ICD-10-CM

## 2017-10-07 DIAGNOSIS — M5440 Lumbago with sciatica, unspecified side: Secondary | ICD-10-CM | POA: Diagnosis not present

## 2017-10-07 LAB — BASIC METABOLIC PANEL
BUN: 16 mg/dL (ref 6–23)
CALCIUM: 9.5 mg/dL (ref 8.4–10.5)
CHLORIDE: 104 meq/L (ref 96–112)
CO2: 27 mEq/L (ref 19–32)
CREATININE: 0.89 mg/dL (ref 0.40–1.50)
GFR: 108.19 mL/min (ref >60.00)
Glucose, Bld: 105 mg/dL — ABNORMAL HIGH (ref 70–99)
Potassium: 4.6 mEq/L (ref 3.5–5.1)
SODIUM: 137 meq/L (ref 135–145)

## 2017-10-07 LAB — PSA: PSA: 2.33 ng/mL (ref 0.10–4.00)

## 2017-10-07 MED ORDER — LOSARTAN POTASSIUM 100 MG PO TABS: 100.0000 mg | ORAL_TABLET | Freq: Every day | ORAL | 3 refills | Status: DC

## 2017-10-07 MED ORDER — METOPROLOL TARTRATE 25 MG PO TABS: 25.0000 mg | ORAL_TABLET | Freq: Two times a day (BID) | ORAL | 3 refills | Status: DC

## 2017-10-07 MED ORDER — TRAMADOL HCL 50 MG PO TABS: ORAL_TABLET | ORAL | 2 refills | Status: DC

## 2017-10-07 MED ORDER — AMLODIPINE BESYLATE 5 MG PO TABS: 5.0000 mg | ORAL_TABLET | Freq: Every day | ORAL | 3 refills | Status: DC

## 2017-10-07 MED ORDER — IBUPROFEN 600 MG PO TABS: 600.0000 mg | ORAL_TABLET | Freq: Two times a day (BID) | ORAL | 0 refills | Status: DC

## 2017-10-07 NOTE — Assessment & Plan Note (Signed)
resolved 

## 2017-10-07 NOTE — Assessment & Plan Note (Signed)
Lopressor, Norvasc NAS diet Nl BP at home

## 2017-10-07 NOTE — Assessment & Plan Note (Signed)
No relapse 

## 2017-10-07 NOTE — Assessment & Plan Note (Signed)
PSA

## 2017-10-07 NOTE — Progress Notes (Signed)
Subjective:  Patient ID: Patrick Morgan, male    DOB: 01/09/1946  Age: 72 y.o. MRN: [id number]  CC: No chief complaint on file.   HPI Patrick Morgan presents for HTN, LBP, OA f/u. BP nl at home  Outpatient Medications Prior to Visit  Medication Sig Dispense Refill  . amLODipine (NORVASC) 5 MG tablet Take 1 tablet (5 mg total) by mouth daily. 90 tablet 3  . amLODipine (NORVASC) 5 MG tablet take 1 tablet by mouth once daily 90 tablet 3  . aspirin 81 MG tablet Take 81 mg by mouth daily.    . IBU 600 MG tablet take 1 tablet by mouth twice a day 60 tablet 0  . losartan (COZAAR) 100 MG tablet Take 1 tablet (100 mg total) by mouth daily. 90 tablet 3  . losartan (COZAAR) 100 MG tablet take 1 tablet by mouth once daily 90 tablet 3  . metoprolol tartrate (LOPRESSOR) 25 MG tablet Take 1 tablet (25 mg total) by mouth 2 (two) times daily. 180 tablet 3  . Multiple Vitamin (MULTIVITAMIN) tablet Take 1 tablet by mouth daily.    . traMADol (ULTRAM) 50 MG tablet take 1-2 tablets by mouth twice a day if needed 120 tablet 2   No facility-administered medications prior to visit.     ROS Review of Systems  Constitutional: Negative for appetite change, fatigue and unexpected weight change.  HENT: Negative for congestion, nosebleeds, sneezing, sore throat and trouble swallowing.   Eyes: Negative for itching and visual disturbance.  Respiratory: Negative for cough.   Cardiovascular: Negative for chest pain, palpitations and leg swelling.  Gastrointestinal: Negative for abdominal distention, blood in stool, diarrhea and nausea.  Genitourinary: Negative for frequency and hematuria.  Musculoskeletal: Positive for arthralgias. Negative for back pain, gait problem, joint swelling and neck pain.  Skin: Negative for rash.  Neurological: Negative for dizziness, tremors, speech difficulty and weakness.  Psychiatric/Behavioral: Negative for agitation, dysphoric mood and sleep disturbance. The patient is not  nervous/anxious.     Objective:  BP (!) 160/88 (BP Location: Left Arm, Patient Position: Sitting, Cuff Size: Large)   Pulse 77   Temp 98 F (36.7 C) (Oral)   Ht 5' 10.5" (1.791 m)   Wt 211 lb (95.7 kg)   SpO2 99%   BMI 29.85 kg/m   BP Readings from Last 3 Encounters:  10/07/17 (!) 160/88  09/08/17 (!) 162/80  04/07/17 140/72    Wt Readings from Last 3 Encounters:  10/07/17 211 lb (95.7 kg)  09/08/17 208 lb (94.3 kg)  04/07/17 205 lb (93 kg)    Physical Exam  Constitutional: He is oriented to person, place, and time. He appears well-developed. No distress.  NAD  HENT:  Mouth/Throat: Oropharynx is clear and moist.  Eyes: Pupils are equal, round, and reactive to light. Conjunctivae are normal.  Neck: Normal range of motion. No JVD present. No thyromegaly present.  Cardiovascular: Normal rate, regular rhythm, normal heart sounds and intact distal pulses. Exam reveals no gallop and no friction rub.  No murmur heard. Pulmonary/Chest: Effort normal and breath sounds normal. No respiratory distress. He has no wheezes. He has no rales. He exhibits no tenderness.  Abdominal: Soft. Bowel sounds are normal. He exhibits no distension and no mass. There is no tenderness. There is no rebound and no guarding.  Musculoskeletal: Normal range of motion. He exhibits no edema or tenderness.  Lymphadenopathy:    He has no cervical adenopathy.  Neurological: He is alert and  oriented to person, place, and time. He has normal reflexes. No cranial nerve deficit. He exhibits normal muscle tone. He displays a negative Romberg sign. Coordination and gait normal.  Skin: Skin is warm and dry. No rash noted.  Psychiatric: He has a normal mood and affect. His behavior is normal. Judgment and thought content normal.    Lab Results  Component Value Date   WBC 6.7 10/06/2016   HGB 14.9 10/06/2016   HCT 42.7 10/06/2016   PLT 197.0 10/06/2016   GLUCOSE 107 (H) 04/07/2017   CHOL 168 10/06/2016   TRIG  82.0 10/06/2016   HDL 41.80 10/06/2016   LDLCALC 110 (H) 10/06/2016   ALT 20 10/06/2016   AST 20 10/06/2016   NA 139 04/07/2017   K 5.1 04/07/2017   CL 103 04/07/2017   CREATININE 0.88 04/07/2017   BUN 17 04/07/2017   CO2 28 04/07/2017   TSH 1.03 10/06/2016   PSA 2.83 04/07/2017   HGBA1C 4.8 04/07/2017    Dg Chest 2 View  Addendum Date: 06/08/2015   ADDENDUM REPORT: 06/08/2015 10:30 ADDENDUM: Impression should read:  No edema or consolidation. Electronically Signed   By: Lowella Grip III M.D.   On: 06/08/2015 10:30  Result Date: 06/08/2015 CLINICAL DATA:  Cardiac palpitations for 2 weeks.  Hypertension. EXAM: CHEST  2 VIEW COMPARISON:  None. FINDINGS: Lungs are clear. Heart size and pulmonary vascularity are normal. No adenopathy. There is degenerative change thoracic spine. IMPRESSION: Edema or consolidation. Electronically Signed: By: Lowella Grip III M.D. On: 06/08/2015 10:26    Assessment & Plan:   There are no diagnoses linked to this encounter. I am having Patrick Morgan maintain his aspirin, multivitamin, amLODipine, losartan, traMADol, losartan, amLODipine, IBU, and metoprolol tartrate.  No orders of the defined types were placed in this encounter.    Follow-up: No follow-ups on file.  Walker Kehr, MD

## 2017-10-21 ENCOUNTER — Encounter: Payer: Self-pay | Admitting: Internal Medicine

## 2017-12-13 ENCOUNTER — Telehealth: Payer: Self-pay | Admitting: Internal Medicine

## 2017-12-13 NOTE — Telephone Encounter (Signed)
Copied from [place] [id number]. Topic: Quick Communication - Rx Refill/Question >> Dec 13, 2017  9:50 AM Robina Ade, Helene Kelp D wrote: Medication: traMADol (ULTRAM) 50 MG tablet  Has the patient contacted their pharmacy? Yes, but pharmacy needs a verbal for Rx since pt said he had a written Rx but they don't have a copy on file. (Agent: If no, request that the patient contact the pharmacy for the refill.) (Agent: If yes, when and what did the pharmacy advise?)  Preferred Pharmacy (with phone number or street name): [facility] [phone] - [place], [address]  Agent: Please be advised that RX refills may take up to 3 business days. We ask that you follow-up with your pharmacy.

## 2017-12-14 NOTE — Telephone Encounter (Signed)
[employer] movement psychotherapist

## 2017-12-15 MED ORDER — TRAMADOL HCL 50 MG PO TABS: ORAL_TABLET | ORAL | 2 refills | Status: DC

## 2017-12-15 NOTE — Telephone Encounter (Signed)
Printed rx [email or web address]

## 2018-03-10 DIAGNOSIS — Z23 Encounter for immunization: Secondary | ICD-10-CM | POA: Diagnosis not present

## 2018-03-16 ENCOUNTER — Other Ambulatory Visit: Payer: Self-pay | Admitting: Emergency Medicine

## 2018-03-16 MED ORDER — IBUPROFEN 600 MG PO TABS: 600.0000 mg | ORAL_TABLET | Freq: Two times a day (BID) | ORAL | 0 refills | Status: DC

## 2018-04-12 ENCOUNTER — Ambulatory Visit: Payer: Medicare Other | Admitting: Internal Medicine

## 2018-04-20 ENCOUNTER — Encounter: Payer: Self-pay | Admitting: Internal Medicine

## 2018-04-20 ENCOUNTER — Ambulatory Visit (INDEPENDENT_AMBULATORY_CARE_PROVIDER_SITE_OTHER): Payer: Medicare Other | Admitting: Internal Medicine

## 2018-04-20 ENCOUNTER — Other Ambulatory Visit (INDEPENDENT_AMBULATORY_CARE_PROVIDER_SITE_OTHER): Payer: Medicare Other

## 2018-04-20 VITALS — BP 144/72 | HR 70 | Temp 97.9°F | Ht 70.5 in | Wt 212.0 lb

## 2018-04-20 DIAGNOSIS — Z0000 Encounter for general adult medical examination without abnormal findings: Secondary | ICD-10-CM | POA: Diagnosis not present

## 2018-04-20 DIAGNOSIS — R972 Elevated prostate specific antigen [PSA]: Secondary | ICD-10-CM | POA: Diagnosis not present

## 2018-04-20 DIAGNOSIS — M5440 Lumbago with sciatica, unspecified side: Secondary | ICD-10-CM

## 2018-04-20 DIAGNOSIS — E785 Hyperlipidemia, unspecified: Secondary | ICD-10-CM

## 2018-04-20 DIAGNOSIS — I10 Essential (primary) hypertension: Secondary | ICD-10-CM | POA: Diagnosis not present

## 2018-04-20 LAB — CBC WITH DIFFERENTIAL/PLATELET
BASOS ABS: 0 10*3/uL (ref 0.0–0.1)
Basophils Relative: 0.6 % (ref 0.0–3.0)
EOS ABS: 0.2 10*3/uL (ref 0.0–0.7)
Eosinophils Relative: 2.6 % (ref 0.0–5.0)
HCT: 43.4 % (ref 39.0–52.0)
HEMOGLOBIN: 15.2 g/dL (ref 13.0–17.0)
Lymphocytes Relative: 23.7 % (ref 12.0–46.0)
Lymphs Abs: 1.5 10*3/uL (ref 0.7–4.0)
MCHC: 35.1 g/dL (ref 30.0–36.0)
MCV: 100.5 fl — ABNORMAL HIGH (ref 78.0–100.0)
Monocytes Absolute: 0.7 10*3/uL (ref 0.1–1.0)
Monocytes Relative: 10.2 % (ref 3.0–12.0)
Neutro Abs: 4 10*3/uL (ref 1.4–7.7)
Neutrophils Relative %: 62.9 % (ref 43.0–77.0)
Platelets: 192 10*3/uL (ref 150.0–400.0)
RBC: 4.32 Mil/uL (ref 4.22–5.81)
RDW: 12.9 % (ref 11.5–15.5)
WBC: 6.4 10*3/uL (ref 4.0–10.5)

## 2018-04-20 LAB — BASIC METABOLIC PANEL
BUN: 24 mg/dL — ABNORMAL HIGH (ref 6–23)
CHLORIDE: 105 meq/L (ref 96–112)
CO2: 24 meq/L (ref 19–32)
Calcium: 9.6 mg/dL (ref 8.4–10.5)
Creatinine, Ser: 0.92 mg/dL (ref 0.40–1.50)
GFR: 103.97 mL/min (ref >60.00)
GLUCOSE: 108 mg/dL — AB (ref 70–99)
Potassium: 4 mEq/L (ref 3.5–5.1)
Sodium: 139 mEq/L (ref 135–145)

## 2018-04-20 LAB — URINALYSIS
BILIRUBIN URINE: NEGATIVE
HGB URINE DIPSTICK: NEGATIVE
Ketones, ur: NEGATIVE
Leukocytes, UA: NEGATIVE
NITRITE: NEGATIVE
Specific Gravity, Urine: 1.025 (ref 1.000–1.030)
Total Protein, Urine: NEGATIVE
UROBILINOGEN UA: 0.2 (ref 0.0–1.0)
Urine Glucose: NEGATIVE
pH: 5.5 (ref 5.0–8.0)

## 2018-04-20 LAB — HEPATIC FUNCTION PANEL
ALBUMIN: 4.6 g/dL (ref 3.5–5.2)
ALT: 23 U/L (ref 0–53)
AST: 20 U/L (ref 0–37)
Alkaline Phosphatase: 67 U/L (ref 39–117)
BILIRUBIN DIRECT: 0.2 mg/dL (ref 0.0–0.3)
BILIRUBIN TOTAL: 0.9 mg/dL (ref 0.2–1.2)
Total Protein: 7.2 g/dL (ref 6.0–8.3)

## 2018-04-20 LAB — LIPID PANEL
CHOLESTEROL: 173 mg/dL (ref 0–200)
HDL: 43.9 mg/dL (ref >39.00)
LDL Cholesterol: 113 mg/dL — ABNORMAL HIGH (ref 0–99)
NONHDL: 129.24
TRIGLYCERIDES: 80 mg/dL (ref 0.0–149.0)
Total CHOL/HDL Ratio: 4
VLDL: 16 mg/dL (ref 0.0–40.0)

## 2018-04-20 LAB — TSH: TSH: 1.33 u[IU]/mL (ref 0.35–4.50)

## 2018-04-20 LAB — PSA: PSA: 2.38 ng/mL (ref 0.10–4.00)

## 2018-04-20 MED ORDER — TRAMADOL HCL 50 MG PO TABS: ORAL_TABLET | ORAL | 2 refills | Status: DC

## 2018-04-20 MED ORDER — IBUPROFEN 600 MG PO TABS: 600.0000 mg | ORAL_TABLET | Freq: Two times a day (BID) | ORAL | 0 refills | Status: DC

## 2018-04-20 MED ORDER — OLMESARTAN MEDOXOMIL 40 MG PO TABS: 40.0000 mg | ORAL_TABLET | Freq: Every day | ORAL | 3 refills | Status: DC

## 2018-04-20 MED ORDER — AMLODIPINE BESYLATE 5 MG PO TABS: 5.0000 mg | ORAL_TABLET | Freq: Every day | ORAL | 3 refills | Status: DC

## 2018-04-20 NOTE — Progress Notes (Signed)
Subjective:  Patient ID: Patrick Morgan, male    DOB: 1945-08-17  Age: 72 y.o. MRN: [id number]  CC: No chief complaint on file.   HPI Patrick Morgan presents for a well exam [facility] C/o LBP - worse  Outpatient Medications Prior to Visit  Medication Sig Dispense Refill  . amLODipine (NORVASC) 5 MG tablet Take 1 tablet (5 mg total) by mouth daily. 90 tablet 3  . aspirin 81 MG tablet Take 81 mg by mouth daily.    Marland Kitchen ibuprofen (IBU) 600 MG tablet Take 1 tablet (600 mg total) by mouth 2 (two) times daily. 180 tablet 0  . losartan (COZAAR) 100 MG tablet Take 1 tablet (100 mg total) by mouth daily. 90 tablet 3  . metoprolol tartrate (LOPRESSOR) 25 MG tablet Take 1 tablet (25 mg total) by mouth 2 (two) times daily. 180 tablet 3  . Multiple Vitamin (MULTIVITAMIN) tablet Take 1 tablet by mouth daily.    . traMADol (ULTRAM) 50 MG tablet take 1-2 tablets by mouth twice a day if needed 120 tablet 2   No facility-administered medications prior to visit.     ROS: Review of Systems  Constitutional: Negative for appetite change, fatigue and unexpected weight change.  HENT: Negative for congestion, nosebleeds, sneezing, sore throat and trouble swallowing.   Eyes: Negative for itching and visual disturbance.  Respiratory: Negative for cough.   Cardiovascular: Negative for chest pain, palpitations and leg swelling.  Gastrointestinal: Negative for abdominal distention, blood in stool, diarrhea and nausea.  Genitourinary: Negative for frequency and hematuria.  Musculoskeletal: Positive for back pain. Negative for gait problem, joint swelling and neck pain.  Skin: Negative for rash.  Neurological: Negative for dizziness, tremors, speech difficulty and weakness.  Psychiatric/Behavioral: Negative for agitation, dysphoric mood, sleep disturbance and suicidal ideas. The patient is not nervous/anxious.     Objective:  BP (!) 144/72 (BP Location: Left Arm, Patient Position: Sitting, Cuff Size: Large)   Pulse  70   Temp 97.9 F (36.6 C) (Oral)   Ht 5' 10.5" (1.791 m)   Wt 212 lb (96.2 kg)   SpO2 97%   BMI 29.99 kg/m   BP Readings from Last 3 Encounters:  04/20/18 (!) 144/72  10/07/17 (!) 160/88  09/08/17 (!) 162/80    Wt Readings from Last 3 Encounters:  04/20/18 212 lb (96.2 kg)  10/07/17 211 lb (95.7 kg)  09/08/17 208 lb (94.3 kg)    Physical Exam  Constitutional: He is oriented to person, place, and time. He appears well-developed. No distress.  NAD  HENT:  Mouth/Throat: Oropharynx is clear and moist.  Eyes: Pupils are equal, round, and reactive to light. Conjunctivae are normal.  Neck: Normal range of motion. No JVD present. No thyromegaly present.  Cardiovascular: Normal rate, regular rhythm, normal heart sounds and intact distal pulses. Exam reveals no gallop and no friction rub.  No murmur heard. Pulmonary/Chest: Effort normal and breath sounds normal. No respiratory distress. He has no wheezes. He has no rales. He exhibits no tenderness.  Abdominal: Soft. Bowel sounds are normal. He exhibits no distension and no mass. There is no tenderness. There is no rebound and no guarding.  Musculoskeletal: Normal range of motion. He exhibits tenderness. He exhibits no edema.  Lymphadenopathy:    He has no cervical adenopathy.  Neurological: He is alert and oriented to person, place, and time. He has normal reflexes. No cranial nerve deficit. He exhibits normal muscle tone. He displays a negative Romberg sign. Coordination  and gait normal.  Skin: Skin is warm and dry. No rash noted.  Psychiatric: He has a normal mood and affect. His behavior is normal. Judgment and thought content normal.  LS tender Declined rectal exam  Lab Results  Component Value Date   WBC 6.7 10/06/2016   HGB 14.9 10/06/2016   HCT 42.7 10/06/2016   PLT 197.0 10/06/2016   GLUCOSE 105 (H) 10/07/2017   CHOL 168 10/06/2016   TRIG 82.0 10/06/2016   HDL 41.80 10/06/2016   LDLCALC 110 (H) 10/06/2016   ALT 20  10/06/2016   AST 20 10/06/2016   NA 137 10/07/2017   K 4.6 10/07/2017   CL 104 10/07/2017   CREATININE 0.89 10/07/2017   BUN 16 10/07/2017   CO2 27 10/07/2017   TSH 1.03 10/06/2016   PSA 2.33 10/07/2017   HGBA1C 4.8 04/07/2017    Dg Chest 2 View  Addendum Date: 06/08/2015   ADDENDUM REPORT: 06/08/2015 10:30 ADDENDUM: Impression should read:  No edema or consolidation. Electronically Signed   By: Lowella Grip III M.D.   On: 06/08/2015 10:30  Result Date: 06/08/2015 CLINICAL DATA:  Cardiac palpitations for 2 weeks.  Hypertension. EXAM: CHEST  2 VIEW COMPARISON:  None. FINDINGS: Lungs are clear. Heart size and pulmonary vascularity are normal. No adenopathy. There is degenerative change thoracic spine. IMPRESSION: Edema or consolidation. Electronically Signed: By: Lowella Grip III M.D. On: 06/08/2015 10:26    Assessment & Plan:   There are no diagnoses linked to this encounter.   No orders of the defined types were placed in this encounter.    Follow-up: No follow-ups on file.  Patrick Kehr, MD

## 2018-04-20 NOTE — Patient Instructions (Signed)

## 2018-04-20 NOTE — Assessment & Plan Note (Signed)
PSA

## 2018-04-20 NOTE — Assessment & Plan Note (Addendum)
Here for medicare wellness/physical  Diet: heart healthy  Physical activity: not sedentary  Depression/mood screen: negative  Hearing: intact to whispered voice  Visual acuity: grossly normal, will sch annual eye exam  ADLs: capable  Fall risk: low to none  Home safety: good  Cognitive evaluation: intact to orientation, naming, recall and repetition  EOL planning: adv directives, full code/ I agree  I have personally reviewed and have noted  1. The patient's medical, surgical and social history  2. Their use of alcohol, tobacco or illicit drugs  3. Their current medications and supplements  4. The patient's functional ability including ADL's, fall risks, home safety risks and hearing or visual impairment.  5. Diet and physical activities  6. Evidence for depression or mood disorders 7. The roster of all physicians providing medical care to patient - is listed in the Snapshot section of the chart and reviewed today.    Today patient counseled on age appropriate routine health concerns for screening and prevention, each reviewed and up to date or declined. Immunizations reviewed and up to date or declined. Labs ordered and reviewed. Risk factors for depression reviewed and negative. Hearing function and visual acuity are intact. ADLs screened and addressed as needed. Functional ability and level of safety reviewed and appropriate. Education, counseling and referrals performed based on assessed risks today. Patient provided with a copy of personalized plan for preventive services.

## 2018-04-20 NOTE — Assessment & Plan Note (Signed)
Tramadol prn ° Potential benefits of a long term opioids use as well as potential risks (i.e. addiction risk, apnea etc) and complications (i.e. Somnolence, constipation and others) were explained to the patient and were aknowledged. ° ° °

## 2018-04-20 NOTE — Assessment & Plan Note (Signed)
Worse D/c Losartan Start Olmesartan

## 2018-04-28 ENCOUNTER — Telehealth: Payer: Self-pay | Admitting: Internal Medicine

## 2018-04-28 NOTE — Telephone Encounter (Signed)
Please advise 

## 2018-04-28 NOTE — Telephone Encounter (Signed)
Copied from [place]. Topic: General - Other >> Apr 28, 2018 12:38 PM Mcneil, Ja-Kwan wrote: Reason for CRM: Pt states the cost of the olmesartan (BENICAR) 40 MG tablet is $91 for a 30 day supply. Pt requests a Rx for another medication that is not so expensive. Cb# [phone]

## 2018-04-29 MED ORDER — TELMISARTAN 80 MG PO TABS: 80.0000 mg | ORAL_TABLET | Freq: Every day | ORAL | 11 refills | Status: DC

## 2018-04-29 NOTE — Telephone Encounter (Signed)
I emailed [facility] (generic).  Let me know if it is expensive Thx

## 2018-04-29 NOTE — Telephone Encounter (Signed)
Pt.notified

## 2018-05-04 MED ORDER — LOSARTAN POTASSIUM 100 MG PO TABS: 100.0000 mg | ORAL_TABLET | Freq: Every day | ORAL | 3 refills | Status: DC

## 2018-05-04 NOTE — Telephone Encounter (Signed)
OK Losartan. If BP is elevated - we will add HCTZ 12.5 mg/d Thx

## 2018-05-04 NOTE — Telephone Encounter (Signed)
Pt is calling to let md know he was dizzy on 05-01-18 and 05-02-18. Pt has stopped taking telmisartan and went back on losartan 100 mg. Pt is not experiencing any dizziness. Please advice. [facility]  [address]. Pt bp last night was 151/69

## 2018-05-04 NOTE — Telephone Encounter (Signed)
Please advise 

## 2018-05-05 NOTE — Telephone Encounter (Signed)
pts wife notified.

## 2018-07-11 ENCOUNTER — Telehealth: Payer: Self-pay | Admitting: Internal Medicine

## 2018-07-11 NOTE — Telephone Encounter (Signed)
Ok We need his letter w/Juror # Thx

## 2018-07-11 NOTE — Telephone Encounter (Signed)
Copied from [place] [id number]. Topic: Quick Communication - See Telephone Encounter >> Jul 11, 2018  8:52 AM Burchel, Abbi R wrote: CRM for notification. See Telephone encounter for: 07/11/18.  Pt requesting a letter excusing him from [place] duty due to inability to sit for long period of time.    Pt: [phone]

## 2018-07-12 ENCOUNTER — Encounter: Payer: Self-pay | Admitting: *Deleted

## 2018-07-12 NOTE — Telephone Encounter (Signed)
Called pt he was not at home will call back later to give [place] the juror #. Sent [facility] CRM for FYI.Marland KitchenJohny Morgan

## 2018-07-12 NOTE — Telephone Encounter (Signed)
Pt called back with Juror # / [id number]

## 2018-07-12 NOTE — Telephone Encounter (Signed)
Called pt back to also get date for jury duty. Generated letter, gave to MD to sign inform pt letter will be left for pick-up at the front desk.Marland KitchenJohny Morgan

## 2018-08-12 ENCOUNTER — Encounter: Payer: Self-pay | Admitting: Cardiovascular Disease

## 2018-08-16 ENCOUNTER — Encounter: Payer: Self-pay | Admitting: Cardiovascular Disease

## 2018-08-24 ENCOUNTER — Ambulatory Visit: Payer: Self-pay | Admitting: *Deleted

## 2018-08-24 NOTE — Telephone Encounter (Signed)
Pt called with having a cough and wheezing atha started last night. He states the wheezing is there most of the time. Cough is non productive most of the time but has coughed up green and yellowish sputum. He states the wheezing is felt in his throat or upper chest.  His head feels stuffy, sounding nasally. No fever or earache. No chest tightness or chest fullness. He has taken Coricidin D and bought Claritin D to take. Also he has used Vicks Vaper Rub to his chest and taken the Emergent C.  Home care advice given to him: using a humidifier, increase his fluids, drink hot tea with lemon and honey and taking 2 teaspoon of honey at bedtime for cough. Pt voiced understanding. Appointment scheduled per pt request with his provider. Advised to call back if symptoms worst or improvement. Pt voiced understanding.  Reason for Disposition . Wheezing is present  Answer Assessment - Initial Assessment Questions 1. ONSET: "When did the cough begin?"      Last night 2. SEVERITY: "How bad is the cough today?"      Better than last night 3. RESPIRATORY DISTRESS: "Describe your breathing."      wheezing 4. FEVER: "Do you have a fever?" If so, ask: "What is your temperature, how was it measured, and when did it start?"     no 5. HEMOPTYSIS: "Are you coughing up any blood?" If so ask: "How much?" (flecks, streaks, tablespoons, etc.)     no 6. TREATMENT: "What have you done so far to treat the cough?" (e.g., meds, fluids, humidifier)     Vicks Vaper Rub, Coricidin D  and Emergent C. 7. CARDIAC HISTORY: "Do you have any history of heart disease?" (e.g., heart attack, congestive heart failure)      irreg heart beat 8. LUNG HISTORY: "Do you have any history of lung disease?"  (e.g., pulmonary embolus, asthma, emphysema)     no 9. PE RISK FACTORS: "Do you have a history of blood clots?" (or: recent major surgery, recent prolonged travel, bedridden)     no 10. OTHER SYMPTOMS: "Do you have any other symptoms?  (e.g., runny nose, wheezing, chest pain)       Runny nose. wheezing 11. PREGNANCY: "Is there any chance you are pregnant?" "When was your last menstrual period?"       n/a 12. TRAVEL: "Have you traveled out of the country in the last month?" (e.g., travel history, exposures)       no  Protocols used: COUGH - ACUTE NON-PRODUCTIVE-A-AH

## 2018-08-24 NOTE — Telephone Encounter (Signed)
Noted will see pt tomorrow 

## 2018-08-25 ENCOUNTER — Ambulatory Visit (INDEPENDENT_AMBULATORY_CARE_PROVIDER_SITE_OTHER): Payer: Medicare Other | Admitting: Internal Medicine

## 2018-08-25 ENCOUNTER — Encounter: Payer: Self-pay | Admitting: Internal Medicine

## 2018-08-25 VITALS — BP 146/68 | HR 84 | Temp 98.2°F | Ht 70.5 in | Wt 216.0 lb

## 2018-08-25 DIAGNOSIS — J069 Acute upper respiratory infection, unspecified: Secondary | ICD-10-CM

## 2018-08-25 DIAGNOSIS — I10 Essential (primary) hypertension: Secondary | ICD-10-CM

## 2018-08-25 MED ORDER — AZITHROMYCIN 250 MG PO TABS: ORAL_TABLET | ORAL | 0 refills | Status: DC

## 2018-08-25 NOTE — Progress Notes (Signed)
Subjective:  Patient ID: Patrick Morgan, male    DOB: January 02, 1946  Age: 73 y.o. MRN: [id number]  CC: No chief complaint on file.   HPI Patrick Morgan presents for URI sx's Green d/c C/o deep cough  Outpatient Medications Prior to Visit  Medication Sig Dispense Refill  . amLODipine (NORVASC) 5 MG tablet Take 1 tablet (5 mg total) by mouth daily. 90 tablet 3  . aspirin 81 MG tablet Take 81 mg by mouth daily.    Marland Kitchen ibuprofen (IBU) 600 MG tablet Take 1 tablet (600 mg total) by mouth 2 (two) times daily. 180 tablet 0  . losartan (COZAAR) 100 MG tablet Take 1 tablet (100 mg total) by mouth daily. 90 tablet 3  . metoprolol tartrate (LOPRESSOR) 25 MG tablet Take 1 tablet (25 mg total) by mouth 2 (two) times daily. 180 tablet 3  . Multiple Vitamin (MULTIVITAMIN) tablet Take 1 tablet by mouth daily.    . traMADol (ULTRAM) 50 MG tablet take 1-2 tablets by mouth twice a day if needed 120 tablet 2   No facility-administered medications prior to visit.     ROS: Review of Systems  Constitutional: Negative for appetite change, fatigue and unexpected weight change.  HENT: Positive for postnasal drip, rhinorrhea and sinus pressure. Negative for congestion, nosebleeds, sneezing, sore throat and trouble swallowing.   Eyes: Negative for itching and visual disturbance.  Respiratory: Positive for cough. Negative for shortness of breath.   Cardiovascular: Negative for chest pain, palpitations and leg swelling.  Gastrointestinal: Negative for abdominal distention, blood in stool, diarrhea and nausea.  Genitourinary: Negative for frequency and hematuria.  Musculoskeletal: Negative for back pain, gait problem, joint swelling and neck pain.  Skin: Negative for rash.  Neurological: Negative for dizziness, tremors, speech difficulty and weakness.  Psychiatric/Behavioral: Negative for agitation, dysphoric mood, sleep disturbance and suicidal ideas. The patient is not nervous/anxious.     Objective:  BP (!)  146/68 (BP Location: Left Arm, Patient Position: Sitting, Cuff Size: Large)   Pulse 84   Temp 98.2 F (36.8 C) (Oral)   Ht 5' 10.5" (1.791 m)   Wt 216 lb (98 kg)   SpO2 97%   BMI 30.55 kg/m   BP Readings from Last 3 Encounters:  08/25/18 (!) 146/68  04/20/18 (!) 144/72  10/07/17 (!) 160/88    Wt Readings from Last 3 Encounters:  08/25/18 216 lb (98 kg)  04/20/18 212 lb (96.2 kg)  10/07/17 211 lb (95.7 kg)    Physical Exam Constitutional:      General: He is not in acute distress.    Appearance: Normal appearance. He is well-developed. He is not ill-appearing or diaphoretic.     Comments: NAD  HENT:     Right Ear: Ear canal normal. There is no impacted cerumen.     Left Ear: Tympanic membrane and ear canal normal.     Nose: Congestion and rhinorrhea present.     Mouth/Throat:     Pharynx: Posterior oropharyngeal erythema present.  Eyes:     Conjunctiva/sclera: Conjunctivae normal.     Pupils: Pupils are equal, round, and reactive to light.  Neck:     Musculoskeletal: Normal range of motion.     Thyroid: No thyromegaly.     Vascular: No JVD.  Cardiovascular:     Rate and Rhythm: Normal rate and regular rhythm.     Heart sounds: Normal heart sounds. No murmur. No friction rub. No gallop.   Pulmonary:  Effort: Pulmonary effort is normal. No respiratory distress.     Breath sounds: Normal breath sounds. No wheezing or rales.  Chest:     Chest wall: No tenderness.  Abdominal:     General: Bowel sounds are normal. There is no distension.     Palpations: Abdomen is soft. There is no mass.     Tenderness: There is no abdominal tenderness. There is no guarding or rebound.  Musculoskeletal: Normal range of motion.        General: No tenderness.  Lymphadenopathy:     Cervical: No cervical adenopathy.  Skin:    General: Skin is warm and dry.     Findings: No rash.  Neurological:     Mental Status: He is alert and oriented to person, place, and time.     Cranial  Nerves: No cranial nerve deficit.     Motor: No abnormal muscle tone.     Coordination: Coordination normal.     Gait: Gait normal.     Deep Tendon Reflexes: Reflexes are normal and symmetric.  Psychiatric:        Behavior: Behavior normal.        Thought Content: Thought content normal.        Judgment: Judgment normal.     Lab Results  Component Value Date   WBC 6.4 04/20/2018   HGB 15.2 04/20/2018   HCT 43.4 04/20/2018   PLT 192.0 04/20/2018   GLUCOSE 108 (H) 04/20/2018   CHOL 173 04/20/2018   TRIG 80.0 04/20/2018   HDL 43.90 04/20/2018   LDLCALC 113 (H) 04/20/2018   ALT 23 04/20/2018   AST 20 04/20/2018   NA 139 04/20/2018   K 4.0 04/20/2018   CL 105 04/20/2018   CREATININE 0.92 04/20/2018   BUN 24 (H) 04/20/2018   CO2 24 04/20/2018   TSH 1.33 04/20/2018   PSA 2.38 04/20/2018   HGBA1C 4.8 04/07/2017    Dg Chest 2 View  Addendum Date: 06/08/2015   ADDENDUM REPORT: 06/08/2015 10:30 ADDENDUM: Impression should read:  No edema or consolidation. Electronically Signed   By: Lowella Grip III M.D.   On: 06/08/2015 10:30  Result Date: 06/08/2015 CLINICAL DATA:  Cardiac palpitations for 2 weeks.  Hypertension. EXAM: CHEST  2 VIEW COMPARISON:  None. FINDINGS: Lungs are clear. Heart size and pulmonary vascularity are normal. No adenopathy. There is degenerative change thoracic spine. IMPRESSION: Edema or consolidation. Electronically Signed: By: Lowella Grip III M.D. On: 06/08/2015 10:26    Assessment & Plan:   There are no diagnoses linked to this encounter.   No orders of the defined types were placed in this encounter.    Follow-up: No follow-ups on file.  Walker Kehr, MD

## 2018-08-25 NOTE — Assessment & Plan Note (Signed)
Zpac 

## 2018-08-25 NOTE — Patient Instructions (Signed)

## 2018-08-25 NOTE — Assessment & Plan Note (Signed)
A cardiac CT scan for calcium scoring offered 

## 2018-09-01 ENCOUNTER — Ambulatory Visit: Payer: Medicare Other | Admitting: Cardiovascular Disease

## 2018-09-12 ENCOUNTER — Ambulatory Visit: Payer: Medicare Other | Admitting: Cardiovascular Disease

## 2018-09-14 ENCOUNTER — Telehealth: Payer: Self-pay | Admitting: *Deleted

## 2018-09-14 NOTE — Telephone Encounter (Signed)
I spoke with pt regarding upcoming one year follow up appointment on March 26,2020.  Pt reports he is feeling well and is agreeable to postponing appointment. I told him we would contact him to reschedule.

## 2018-09-22 ENCOUNTER — Ambulatory Visit: Payer: Medicare Other | Admitting: Cardiovascular Disease

## 2018-10-14 ENCOUNTER — Telehealth: Payer: Self-pay | Admitting: Cardiovascular Disease

## 2018-10-14 NOTE — Telephone Encounter (Signed)
PHONE/Doximity visit on 10/17/2018    Phone call to obtain consent   -  10/14/2018         Virtual Visit Pre-Appointment Phone Call  Steps For Call:  1. Confirm consent - "In the setting of the current Covid19 crisis, you are scheduled for a (phone or video) visit with your provider on (date) at (time).  Just as we do with many in-office visits, in order for you to participate in this visit, we must obtain consent.  If you'd like, I can send this to your mychart (if signed up) or email for you to review.  Otherwise, I can obtain your verbal consent now.  All virtual visits are billed to your insurance company just like a normal visit would be.  By agreeing to a virtual visit, we'd like you to understand that the technology does not allow for your provider to perform an examination, and thus may limit your provider's ability to fully assess your condition. If your provider identifies any concerns that need to be evaluated in person, we will make arrangements to do so.  Finally, though the technology is pretty good, we cannot assure that it will always work on either your or our end, and in the setting of a video visit, we may have to convert it to a phone-only visit.  In either situation, we cannot ensure that we have a secure connection.  Are you willing to proceed?" STAFF: Did the patient verbally acknowledge consent to telehealth visit? Document YES/NO here: YES  2. Confirm the BEST phone number to call the day of the visit by including in appointment notes  3. Give patient instructions for WebEx/MyChart download to smartphone as below or Doximity/Doxy.me if video visit (depending on what platform provider is using)  4. Advise patient to be prepared with their blood pressure, heart rate, weight, any heart rhythm information, their current medicines, and a piece of paper and pen handy for any instructions they may receive the day of their visit  5. Inform patient they will receive a  phone call 15 minutes prior to their appointment time (may be from unknown caller ID) so they should be prepared to answer  6. Confirm that appointment type is correct in Epic appointment notes (VIDEO vs PHONE)     TELEPHONE CALL NOTE  Patrick Morgan has been deemed a candidate for a follow-up tele-health visit to limit community exposure during the Covid-19 pandemic. I spoke with the patient via phone to ensure availability of phone/video source, confirm preferred email & phone number, and discuss instructions and expectations.  I reminded Patrick Morgan to be prepared with any vital sign and/or heart rhythm information that could potentially be obtained via home monitoring, at the time of his visit. I reminded Patrick Morgan to expect a phone call at the time of his visit if his visit.  Thayer Headings 10/14/2018 2:19 PM   INSTRUCTIONS FOR DOWNLOADING THE [place] APP TO SMARTPHONE  - If Apple, ask patient to go to App Store and type in WebEx in the search bar. [place] [employer], the blue/green circle. If Android, go to [employer] and type in [employer] in the search bar. The app is free but as with any other app downloads, their phone may require them to verify saved payment information or Apple/Android password.  - The patient does NOT have to create an account. - On the day of the visit, the assist will walk the patient  through joining the meeting with the meeting number/password.  INSTRUCTIONS FOR DOWNLOADING THE MYCHART APP TO SMARTPHONE  - The patient must first make sure to have activated MyChart and know their login information - If Apple, go to [employer] and type in MyChart in the search bar and download the app. If Android, ask patient to go to [employer] and type in [place] in the search bar and download the app. The app is free but as with any other app downloads, their phone may require them to verify saved payment information or Apple/Android password.  - The  patient will need to then log into the app with their MyChart username and password, and select [facility] as their healthcare provider to link the account. When it is time for your visit, go to the MyChart app, find appointments, and click Begin Video Visit. Be sure to Select Allow for your device to access the Microphone and Camera for your visit. You will then be connected, and your provider will be with you shortly.  **If they have any issues connecting, or need assistance please contact MyChart service desk [phone])**  **If using a computer, in order to ensure the best quality for their visit they will need to use either of the following Internet Browsers: [employer], or Google Chrome**  IF USING DOXIMITY or DOXY.ME - The patient will receive a link just prior to their visit, either by text or email (to be determined day of appointment depending on if it's doxy.me or Doximity).     FULL LENGTH CONSENT FOR TELE-HEALTH VISIT   I hereby voluntarily request, consent and authorize [facility] and its employed or contracted physicians, physician assistants, nurse practitioners or other licensed health care professionals (the Practitioner), to provide me with telemedicine health care services (the Services") as deemed necessary by the treating Practitioner. I acknowledge and consent to receive the Services by the Practitioner via telemedicine. I understand that the telemedicine visit will involve communicating with the Practitioner through live audiovisual communication technology and the disclosure of certain medical information by electronic transmission. I acknowledge that I have been given the opportunity to request an in-person assessment or other available alternative prior to the telemedicine visit and am voluntarily participating in the telemedicine visit.  I understand that I have the right to withhold or withdraw my consent to the use of telemedicine in the course  of my care at any time, without affecting my right to future care or treatment, and that the Practitioner or I may terminate the telemedicine visit at any time. I understand that I have the right to inspect all information obtained and/or recorded in the course of the telemedicine visit and may receive copies of available information for a reasonable fee.  I understand that some of the potential risks of receiving the Services via telemedicine include:   Delay or interruption in medical evaluation due to technological equipment failure or disruption;  Information transmitted may not be sufficient (e.g. poor resolution of images) to allow for appropriate medical decision making by the Practitioner; and/or   In rare instances, security protocols could fail, causing a breach of personal health information.  Furthermore, I acknowledge that it is my responsibility to provide information about my medical history, conditions and care that is complete and accurate to the best of my ability. I acknowledge that Practitioner's advice, recommendations, and/or decision may be based on factors not within their control, such as incomplete or inaccurate data provided by me  or distortions of diagnostic images or specimens that may result from electronic transmissions. I understand that the practice of medicine is not an exact science and that Practitioner makes no warranties or guarantees regarding treatment outcomes. I acknowledge that I will receive a copy of this consent concurrently upon execution via email to the email address I last provided but may also request a printed copy by calling the office of [facility].    I understand that my insurance will be billed for this visit.   I have read or had this consent read to me.  I understand the contents of this consent, which adequately explains the benefits and risks of the Services being provided via telemedicine.   I have been provided ample opportunity to  ask questions regarding this consent and the Services and have had my questions answered to my satisfaction.  I give my informed consent for the services to be provided through the use of telemedicine in my medical care  By participating in this telemedicine visit I agree to the above.

## 2018-10-14 NOTE — Telephone Encounter (Signed)
Patient scheduled for 10/17/2018 @ 2pm.

## 2018-10-17 ENCOUNTER — Other Ambulatory Visit: Payer: Self-pay

## 2018-10-17 ENCOUNTER — Encounter: Payer: Self-pay | Admitting: Cardiovascular Disease

## 2018-10-17 ENCOUNTER — Telehealth (INDEPENDENT_AMBULATORY_CARE_PROVIDER_SITE_OTHER): Payer: Medicare Other | Admitting: Cardiovascular Disease

## 2018-10-17 VITALS — BP 137/68 | Ht 70.5 in | Wt 216.0 lb

## 2018-10-17 DIAGNOSIS — I493 Ventricular premature depolarization: Secondary | ICD-10-CM

## 2018-10-17 DIAGNOSIS — I10 Essential (primary) hypertension: Secondary | ICD-10-CM

## 2018-10-17 NOTE — Patient Instructions (Signed)
Medication Instructions:  Your physician recommends that you continue on your current medications as directed. Please refer to the Current Medication list given to you today.  If you need a refill on your cardiac medications before your next appointment, please call your pharmacy.   Lab work: None Ordered  If you have labs (blood work) drawn today and your tests are completely normal, you will receive your results only by: Marland Kitchen MyChart Message (if you have MyChart) OR . A paper copy in the mail If you have any lab test that is abnormal or we need to change your treatment, we will call you to review the results.  Testing/Procedures: None ordered  Follow-Up: At [facility], you and your health needs are our priority.  As part of our continuing mission to provide you with exceptional heart care, we have created designated Provider Care Teams.  These Care Teams include your primary Cardiologist (physician) and Advanced Practice Providers (APPs -  Physician Assistants and Nurse Practitioners) who all work together to provide you with the care you need, when you need it. . You will need a follow up appointment in 1 year.  Please call our office 2 months in advance to schedule this appointment.  You may see Darlina Guys, MD or one of the following Advanced Practice Providers on your designated Care Team:   . Lyda Jester, PA-C . Dayna Dunn, PA-C . Ermalinda Barrios, PA-C  Any Other Special Instructions Will Be Listed Below (If Applicable).

## 2018-10-17 NOTE — Progress Notes (Signed)
Virtual Visit via Video Note   This visit type was conducted due to national recommendations for restrictions regarding the COVID-19 Pandemic (e.g. social distancing) in an effort to limit this patient's exposure and mitigate transmission in our community.  Due to his co-morbid illnesses, this patient is at least at moderate risk for complications without adequate follow up.  This format is felt to be most appropriate for this patient at this time.  All issues noted in this document were discussed and addressed.  A limited physical exam was performed with this format.  Please refer to the patient's chart for his consent to telehealth for [facility].   Evaluation Performed:  Follow-up visit  Date:  10/17/2018   ID:  Patrick Morgan, DOB 11-Nov-1945, MRN [id number]  Patient Location: Home Provider Location: Office  PCP:  Cassandria Anger, MD  Cardiologist:  Lauree Chandler, MD  Electrophysiologist:  None   Chief Complaint:  Follow up- palpitations  History of Present Illness:    Patrick Morgan is a 73 y.o. male with history of HTN, palpitations and PVCs who is being seen today by virtual e-visit due to the Covid19 pandemic. He was seen in our office 06/28/15 by Almyra Deforest, PA for palpitations. EKG with sinus tachycardia. TSH normal. Echo December 2016 with normal LV systolic function, LVEF 55-97%, no valve disease. 24 hour cardiac monitor with sinus rhythm, rare PVCs, frequent PACs. He was started on a beta blocker. He has done well since then.   He tells me today that he feels great. No chest pain or dyspnea. No lower extremity.   The patient does not have symptoms concerning for COVID-19 infection (fever, chills, cough, or new shortness of breath).    Past Medical History:  Diagnosis Date  . Hypertension   . PAC (premature atrial contraction)   . Vertigo    Past Surgical History:  Procedure Laterality Date  . NO PAST SURGERIES       Current Meds  Medication Sig   . amLODipine (NORVASC) 5 MG tablet Take 1 tablet (5 mg total) by mouth daily.  Marland Kitchen aspirin 81 MG tablet Take 81 mg by mouth daily.  Marland Kitchen ibuprofen (IBU) 600 MG tablet Take 1 tablet (600 mg total) by mouth 2 (two) times daily.  Marland Kitchen losartan (COZAAR) 100 MG tablet Take 1 tablet (100 mg total) by mouth daily.  . metoprolol tartrate (LOPRESSOR) 25 MG tablet Take 1 tablet (25 mg total) by mouth 2 (two) times daily.  . Multiple Vitamin (MULTIVITAMIN) tablet Take 1 tablet by mouth daily.  . traMADol (ULTRAM) 50 MG tablet take 1-2 tablets by mouth twice a day if needed  . VITAMIN D PO Take 2,000 Units by mouth daily.     Allergies:   Codeine and Telmisartan   Social History   Tobacco Use  . Smoking status: Former Research scientist (life sciences)  . Smokeless tobacco: Never Used  . Tobacco comment: quit 1975  Substance Use Topics  . Alcohol use: No    Alcohol/week: 0.0 standard drinks    Comment: quit 2002  . Drug use: No     Family Hx: The patient's family history includes Alzheimer's disease in his mother; Cancer (age of onset: 89) in his father; Diabetes in his father.  ROS:   Please see the history of present illness.    All other systems reviewed and are negative.   Prior CV studies:   The following studies were reviewed today:  Echo 06/27/15: -  Left ventricle: The cavity size was normal. Systolic function was   normal. The estimated ejection fraction was in the range of 60%   to 65%. Wall motion was normal; there were no regional wall   motion abnormalities. - Atrial septum: No defect or patent foramen ovale was identified.  Labs/Other Tests and Data Reviewed:    EKG:  No ECG reviewed.  Recent Labs: 04/20/2018: ALT 23; BUN 24; Creatinine, Ser 0.92; Hemoglobin 15.2; Platelets 192.0; Potassium 4.0; Sodium 139; TSH 1.33   Recent Lipid Panel Lab Results  Component Value Date/Time   CHOL 173 04/20/2018 08:58 AM   TRIG 80.0 04/20/2018 08:58 AM   HDL 43.90 04/20/2018 08:58 AM   CHOLHDL 4 04/20/2018  08:58 AM   LDLCALC 113 (H) 04/20/2018 08:58 AM    Wt Readings from Last 3 Encounters:  10/17/18 216 lb (98 kg)  08/25/18 216 lb (98 kg)  04/20/18 212 lb (96.2 kg)     Objective:    Vital Signs:  BP 137/68   Ht 5' 10.5" (1.791 m)   Wt 216 lb (98 kg)   BMI 30.55 kg/m    VITAL SIGNS:  reviewed GEN:  no acute distress  ASSESSMENT & PLAN:    1. Palpitations/PACs/PVCs: He is known to have PACs and PVCs by cardiac monitor in January 2017. Echo December 2016 with normal LV function, no valve disease. No palpitations. Continue beta blocker.    2. HTN: BP well controlled at home.   COVID-19 Education: The signs and symptoms of COVID-19 were discussed with the patient and how to seek care for testing (follow up with PCP or arrange E-visit).  The importance of social distancing was discussed today.  Time:   Today, I have spent 10 minutes with the patient with telehealth technology discussing the above problems.  I spent 10 additional minutes in chart prep and review and finalizing the plan.    Medication Adjustments/Labs and Tests Ordered: Current medicines are reviewed at length with the patient today.  Concerns regarding medicines are outlined above.   Tests Ordered: No orders of the defined types were placed in this encounter.   Medication Changes: No orders of the defined types were placed in this encounter.   Disposition:  Follow up in 1 year(s)  Signed, Lauree Chandler, MD  10/17/2018 1:32 PM    [facility]

## 2018-12-03 ENCOUNTER — Other Ambulatory Visit: Payer: Self-pay | Admitting: Internal Medicine

## 2018-12-31 ENCOUNTER — Other Ambulatory Visit: Payer: Self-pay | Admitting: Internal Medicine

## 2019-03-21 DIAGNOSIS — Z23 Encounter for immunization: Secondary | ICD-10-CM | POA: Diagnosis not present

## 2019-04-26 ENCOUNTER — Ambulatory Visit: Payer: Medicare Other | Admitting: Internal Medicine

## 2019-04-27 ENCOUNTER — Ambulatory Visit (INDEPENDENT_AMBULATORY_CARE_PROVIDER_SITE_OTHER): Payer: Medicare Other | Admitting: Internal Medicine

## 2019-04-27 ENCOUNTER — Encounter: Payer: Self-pay | Admitting: Internal Medicine

## 2019-04-27 DIAGNOSIS — I10 Essential (primary) hypertension: Secondary | ICD-10-CM | POA: Diagnosis not present

## 2019-04-27 DIAGNOSIS — G8929 Other chronic pain: Secondary | ICD-10-CM | POA: Diagnosis not present

## 2019-04-27 DIAGNOSIS — M5440 Lumbago with sciatica, unspecified side: Secondary | ICD-10-CM | POA: Diagnosis not present

## 2019-04-27 DIAGNOSIS — M25562 Pain in left knee: Secondary | ICD-10-CM

## 2019-04-27 DIAGNOSIS — M25561 Pain in right knee: Secondary | ICD-10-CM

## 2019-04-27 MED ORDER — LOSARTAN POTASSIUM 100 MG PO TABS: 100.0000 mg | ORAL_TABLET | Freq: Every day | ORAL | 3 refills | Status: DC

## 2019-04-27 MED ORDER — AMLODIPINE BESYLATE 5 MG PO TABS: 5.0000 mg | ORAL_TABLET | Freq: Every day | ORAL | 3 refills | Status: DC

## 2019-04-27 MED ORDER — METOPROLOL TARTRATE 25 MG PO TABS: 25.0000 mg | ORAL_TABLET | Freq: Two times a day (BID) | ORAL | 3 refills | Status: DC

## 2019-04-27 MED ORDER — TRAMADOL HCL 50 MG PO TABS: ORAL_TABLET | ORAL | 2 refills | Status: DC

## 2019-04-27 NOTE — Assessment & Plan Note (Signed)
Tramadol prn Add OTC Advil, Tylenol prn

## 2019-04-27 NOTE — Progress Notes (Signed)
Virtual Visit via Video Note  I connected with Patrick Morgan on 04/27/19 at  2:40 PM EDT by a video enabled telemedicine application and verified that I am speaking with the correct person using two identifiers.   I discussed the limitations of evaluation and management by telemedicine and the availability of in person appointments. The patient expressed understanding and agreed to proceed.  History of Present Illness: We need to follow-up on OA - worse, LBP, HTN f/u Some OTC meds, Ibuprofen 600 mg  - too big  There has been no runny nose, cough, chest pain, shortness of breath, abdominal pain, diarrhea, constipation,skin rashes.   Observations/Objective: The patient appears to be in no acute distress, looks well.  Assessment and Plan:  See my Assessment and Plan. Follow Up Instructions:    I discussed the assessment and treatment plan with the patient. The patient was provided an opportunity to ask questions and all were answered. The patient agreed with the plan and demonstrated an understanding of the instructions.   The patient was advised to call back or seek an in-person evaluation if the symptoms worsen or if the condition fails to improve as anticipated.  I provided face-to-face time during this encounter. We were at different locations.   Walker Kehr, MD

## 2019-04-27 NOTE — Assessment & Plan Note (Signed)
Tramadol prn - can use more often Add OTC Advil, Tylenol prn  Potential benefits of a long term opioids use as well as potential risks (i.e. addiction risk, apnea etc) and complications (i.e. Somnolence, constipation and others) were explained to the patient and were aknowledged.

## 2019-04-27 NOTE — Assessment & Plan Note (Signed)
Losartan, Metoprolol, Amlodipine NAS diet

## 2019-05-23 ENCOUNTER — Other Ambulatory Visit: Payer: Self-pay

## 2019-06-24 ENCOUNTER — Other Ambulatory Visit: Payer: Self-pay | Admitting: Internal Medicine

## 2019-07-07 DIAGNOSIS — Z23 Encounter for immunization: Secondary | ICD-10-CM | POA: Diagnosis not present

## 2019-08-04 DIAGNOSIS — Z23 Encounter for immunization: Secondary | ICD-10-CM | POA: Diagnosis not present

## 2019-09-04 ENCOUNTER — Telehealth: Payer: Self-pay | Admitting: Cardiovascular Disease

## 2019-09-04 NOTE — Telephone Encounter (Signed)
Patient wants to schedule a virtual appt with Dr. Angelena Form on 10/27/19. Can I turn an OV to virtual?

## 2019-09-04 NOTE — Telephone Encounter (Signed)
Appointment has been rescheduled to virtual.

## 2019-10-27 ENCOUNTER — Encounter: Payer: Self-pay | Admitting: Cardiovascular Disease

## 2019-10-27 ENCOUNTER — Other Ambulatory Visit: Payer: Self-pay

## 2019-10-27 ENCOUNTER — Telehealth (INDEPENDENT_AMBULATORY_CARE_PROVIDER_SITE_OTHER): Payer: Medicare Other | Admitting: Cardiovascular Disease

## 2019-10-27 VITALS — BP 155/69 | HR 77 | Ht 70.5 in | Wt 203.0 lb

## 2019-10-27 DIAGNOSIS — I493 Ventricular premature depolarization: Secondary | ICD-10-CM | POA: Diagnosis not present

## 2019-10-27 MED ORDER — METOPROLOL TARTRATE 25 MG PO TABS: 25.0000 mg | ORAL_TABLET | Freq: Two times a day (BID) | ORAL | 3 refills | Status: DC

## 2019-10-27 NOTE — Progress Notes (Signed)
Virtual Visit via Video Note   This visit type was conducted due to national recommendations for restrictions regarding the COVID-19 Pandemic (e.g. social distancing) in an effort to limit this patient's exposure and mitigate transmission in our community.  Due to his co-morbid illnesses, this patient is at least at moderate risk for complications without adequate follow up.  This format is felt to be most appropriate for this patient at this time.  All issues noted in this document were discussed and addressed.  A limited physical exam was performed with this format.  Please refer to the patient's chart for his consent to telehealth for [facility].   Evaluation Performed:  Follow-up visit  Date:  10/27/2019   ID:  Patrick Morgan, DOB 07/16/1945, MRN [id number]  Patient Location: Home Provider Location: Office  PCP:  Cassandria Anger, MD  Cardiologist:  Lauree Chandler, MD  Electrophysiologist:  None   Chief Complaint:  Follow up: Palpitations  History of Present Illness:    Patrick Morgan is a 74 y.o. male with history of HTN, palpitations and PVCs who is being seen today by virtual e-visit due to the Covid19 pandemic. He was seen in April 2020 by virtual visit  He had been seen previously for palpitations. EKG with sinus tachycardia. TSH normal. Echo December 2016 with normal LV systolic function, LVEF [id number], no valve disease. 24 hour cardiac monitor in 2016 with sinus rhythm, rare PVCs, frequent PACs. He was started on a beta blocker. He has done well since then.   He denies any chest pain, dyspnea, palpitations, lower extremity edema, orthopnea, PND, dizziness, near syncope or syncope.   The patient does not have symptoms concerning for COVID-19 infection (fever, chills, cough, or new shortness of breath).   Past Medical History:  Diagnosis Date  . Hypertension   . PAC (premature atrial contraction)   . Vertigo    Past Surgical History:  Procedure Laterality  Date  . NO PAST SURGERIES       Current Meds  Medication Sig  . amLODipine (NORVASC) 5 MG tablet TAKE 1 TABLET(5 MG) BY MOUTH DAILY  . aspirin 81 MG tablet Take 81 mg by mouth daily.  Marland Kitchen losartan (COZAAR) 100 MG tablet TAKE 1 TABLET(100 MG) BY MOUTH DAILY  . metoprolol tartrate (LOPRESSOR) 25 MG tablet Take 1 tablet (25 mg total) by mouth 2 (two) times daily.  . Multiple Vitamin (MULTIVITAMIN) tablet Take 1 tablet by mouth daily.  . traMADol (ULTRAM) 50 MG tablet TAKE 1 TO 2 TABLETS BY MOUTH TWICE DAILY AS NEEDED  . VITAMIN D PO Take 2,000 Units by mouth daily.  . [DISCONTINUED] metoprolol tartrate (LOPRESSOR) 25 MG tablet Take 1 tablet (25 mg total) by mouth 2 (two) times daily.     Allergies:   Codeine and Telmisartan   Social History   Tobacco Use  . Smoking status: Former Research scientist (life sciences)  . Smokeless tobacco: Never Used  . Tobacco comment: quit 1975  Substance Use Topics  . Alcohol use: No    Alcohol/week: 0.0 standard drinks    Comment: quit 2002  . Drug use: No     Family Hx: The patient's family history includes Alzheimer's disease in his mother; Cancer (age of onset: 23) in his father; Diabetes in his father.  ROS:   Please see the history of present illness.    All other systems reviewed and are negative.   Prior CV studies:   The following studies were reviewed  today:  Echo 06/27/15: - Left ventricle: The cavity size was normal. Systolic function was   normal. The estimated ejection fraction was in the range of 60%   to 65%. Wall motion was normal; there were no regional wall   motion abnormalities. - Atrial septum: No defect or patent foramen ovale was identified.  Labs/Other Tests and Data Reviewed:    EKG:  No ECG reviewed.    Recent Labs: No results found for requested labs within last 8760 hours.   Recent Lipid Panel Lab Results  Component Value Date/Time   CHOL 173 04/20/2018 08:58 AM   TRIG 80.0 04/20/2018 08:58 AM   HDL 43.90 04/20/2018 08:58 AM     CHOLHDL 4 04/20/2018 08:58 AM   LDLCALC 113 (H) 04/20/2018 08:58 AM    Wt Readings from Last 3 Encounters:  10/27/19 203 lb (92.1 kg)  10/17/18 216 lb (98 kg)  08/25/18 216 lb (98 kg)     Objective:    Vital Signs:  BP (!) 155/69   Pulse 77   Ht 5' 10.5" (1.791 m)   Wt 203 lb (92.1 kg)   BMI 28.72 kg/m    Vitals reviewed. Phone visit so no exam  ASSESSMENT & PLAN:    1. Palpitations/PACs/PVCs: He is known to have PACs and PVCs by cardiac monitor in January 2017. Echo December 2016 with normal LV function, no valve disease. He is doing well on the beta blocker. No palpitations. No changes today.     2. HTN: BP is controlled at home per pt. Has been [id number] systolic.   COVID-19 Education: The signs and symptoms of COVID-19 were discussed with the patient and how to seek care for testing (follow up with PCP or arrange E-visit).  The importance of social distancing was discussed today.  Time:   Today, I have spent 10 minutes with the patient with telehealth technology discussing the above problems.  I spent 15 additional minutes in chart prep and review and finalizing the plan.    Medication Adjustments/Labs and Tests Ordered: Current medicines are reviewed at length with the patient today.  Concerns regarding medicines are outlined above.   Tests Ordered: No orders of the defined types were placed in this encounter.   Medication Changes: Meds ordered this encounter  Medications  . metoprolol tartrate (LOPRESSOR) 25 MG tablet    Sig: Take 1 tablet (25 mg total) by mouth 2 (two) times daily.    Dispense:  90 tablet    Refill:  3    Disposition:  Follow up in 1 year(s)  Signed, Lauree Chandler, MD  10/27/2019 4:05 PM    [facility]

## 2019-10-27 NOTE — Patient Instructions (Signed)
Medication Instructions:  No changes *If you need a refill on your cardiac medications before your next appointment, please call your pharmacy*   Lab Work: none If you have labs (blood work) drawn today and your tests are completely normal, you will receive your results only by: Marland Kitchen MyChart Message (if you have MyChart) OR . A paper copy in the mail If you have any lab test that is abnormal or we need to change your treatment, we will call you to review the results.   Testing/Procedures: None   Follow-Up: At [facility], you and your health needs are our priority.  As part of our continuing mission to provide you with exceptional heart care, we have created designated Provider Care Teams.  These Care Teams include your primary Cardiologist (physician) and Advanced Practice Providers (APPs -  Physician Assistants and Nurse Practitioners) who all work together to provide you with the care you need, when you need it.  Your next appointment:   12 month(s)  The format for your next appointment:   Either In Person or Virtual  Provider:   Lauree Chandler, MD   Other Instructions

## 2020-01-10 ENCOUNTER — Other Ambulatory Visit: Payer: Self-pay | Admitting: Internal Medicine

## 2020-04-07 DIAGNOSIS — Z23 Encounter for immunization: Secondary | ICD-10-CM | POA: Diagnosis not present

## 2020-05-07 DIAGNOSIS — Z23 Encounter for immunization: Secondary | ICD-10-CM | POA: Diagnosis not present

## 2020-06-08 ENCOUNTER — Other Ambulatory Visit: Payer: Self-pay | Admitting: Internal Medicine

## 2020-06-09 ENCOUNTER — Other Ambulatory Visit: Payer: Self-pay | Admitting: Internal Medicine

## 2020-07-27 ENCOUNTER — Other Ambulatory Visit: Payer: Self-pay | Admitting: Internal Medicine

## 2020-08-07 ENCOUNTER — Telehealth: Payer: Self-pay | Admitting: Internal Medicine

## 2020-08-07 NOTE — Telephone Encounter (Signed)
Patient dropped off a renewal parking placard.  Form has been completed &Placed in providers box to review and sign if approved.  Patient requesting form mailed once completed.

## 2020-08-08 ENCOUNTER — Ambulatory Visit: Payer: Medicare Other | Admitting: Internal Medicine

## 2020-08-14 NOTE — Telephone Encounter (Signed)
Form has been signed, Copy sent to scan.  Mailed to patient as requested.

## 2020-09-04 ENCOUNTER — Telehealth: Payer: Self-pay | Admitting: Internal Medicine

## 2020-09-04 NOTE — Telephone Encounter (Signed)
LVM for pt to rtn my call to schedule AWV with [facility]. Please schedule appt if pt calls the office.  

## 2020-09-18 ENCOUNTER — Encounter: Payer: Self-pay | Admitting: Internal Medicine

## 2020-09-18 ENCOUNTER — Other Ambulatory Visit: Payer: Self-pay

## 2020-09-18 ENCOUNTER — Ambulatory Visit (INDEPENDENT_AMBULATORY_CARE_PROVIDER_SITE_OTHER): Payer: Medicare Other | Admitting: Internal Medicine

## 2020-09-18 VITALS — BP 158/70 | HR 81 | Temp 98.8°F | Ht 70.5 in | Wt 204.2 lb

## 2020-09-18 DIAGNOSIS — E785 Hyperlipidemia, unspecified: Secondary | ICD-10-CM | POA: Diagnosis not present

## 2020-09-18 DIAGNOSIS — M5440 Lumbago with sciatica, unspecified side: Secondary | ICD-10-CM

## 2020-09-18 DIAGNOSIS — R972 Elevated prostate specific antigen [PSA]: Secondary | ICD-10-CM | POA: Diagnosis not present

## 2020-09-18 DIAGNOSIS — I10 Essential (primary) hypertension: Secondary | ICD-10-CM

## 2020-09-18 LAB — URINALYSIS
Bilirubin Urine: NEGATIVE
Hgb urine dipstick: NEGATIVE
Ketones, ur: NEGATIVE
Leukocytes,Ua: NEGATIVE
Nitrite: NEGATIVE
Specific Gravity, Urine: 1.025 (ref 1.000–1.030)
Total Protein, Urine: NEGATIVE
Urine Glucose: NEGATIVE
Urobilinogen, UA: 1 (ref 0.0–1.0)
pH: 6 (ref 5.0–8.0)

## 2020-09-18 LAB — COMPREHENSIVE METABOLIC PANEL
ALT: 19 U/L (ref 0–53)
AST: 20 U/L (ref 0–37)
Albumin: 4.8 g/dL (ref 3.5–5.2)
Alkaline Phosphatase: 60 U/L (ref 39–117)
BUN: 19 mg/dL (ref 6–23)
CO2: 26 mEq/L (ref 19–32)
Calcium: 10.1 mg/dL (ref 8.4–10.5)
Chloride: 105 mEq/L (ref 96–112)
Creatinine, Ser: 0.86 mg/dL (ref 0.40–1.50)
GFR: 85.29 mL/min (ref >60.00)
Glucose, Bld: 104 mg/dL — ABNORMAL HIGH (ref 70–99)
Potassium: 4.5 mEq/L (ref 3.5–5.1)
Sodium: 141 mEq/L (ref 135–145)
Total Bilirubin: 1 mg/dL (ref 0.2–1.2)
Total Protein: 7.4 g/dL (ref 6.0–8.3)

## 2020-09-18 LAB — LIPID PANEL
Cholesterol: 203 mg/dL — ABNORMAL HIGH (ref 0–200)
HDL: 52.9 mg/dL (ref >39.00)
LDL Cholesterol: 135 mg/dL — ABNORMAL HIGH (ref 0–99)
NonHDL: 149.91
Total CHOL/HDL Ratio: 4
Triglycerides: 75 mg/dL (ref 0.0–149.0)
VLDL: 15 mg/dL (ref 0.0–40.0)

## 2020-09-18 LAB — CBC WITH DIFFERENTIAL/PLATELET
Basophils Absolute: 0 10*3/uL (ref 0.0–0.1)
Basophils Relative: 0.7 % (ref 0.0–3.0)
Eosinophils Absolute: 0.2 10*3/uL (ref 0.0–0.7)
Eosinophils Relative: 2.3 % (ref 0.0–5.0)
HCT: 44.9 % (ref 39.0–52.0)
Hemoglobin: 15.6 g/dL (ref 13.0–17.0)
Lymphocytes Relative: 27.5 % (ref 12.0–46.0)
Lymphs Abs: 1.8 10*3/uL (ref 0.7–4.0)
MCHC: 34.7 g/dL (ref 30.0–36.0)
MCV: 102.6 fl — ABNORMAL HIGH (ref 78.0–100.0)
Monocytes Absolute: 0.7 10*3/uL (ref 0.1–1.0)
Monocytes Relative: 10.1 % (ref 3.0–12.0)
Neutro Abs: 4 10*3/uL (ref 1.4–7.7)
Neutrophils Relative %: 59.4 % (ref 43.0–77.0)
Platelets: 190 10*3/uL (ref 150.0–400.0)
RBC: 4.37 Mil/uL (ref 4.22–5.81)
RDW: 13 % (ref 11.5–15.5)
WBC: 6.7 10*3/uL (ref 4.0–10.5)

## 2020-09-18 LAB — PSA: PSA: 2.88 ng/mL (ref 0.10–4.00)

## 2020-09-18 LAB — TSH: TSH: 1.23 u[IU]/mL (ref 0.35–4.50)

## 2020-09-18 MED ORDER — METOPROLOL TARTRATE 25 MG PO TABS: ORAL_TABLET | ORAL | 3 refills | Status: DC

## 2020-09-18 MED ORDER — LOSARTAN POTASSIUM 100 MG PO TABS: ORAL_TABLET | ORAL | 3 refills | Status: DC

## 2020-09-18 MED ORDER — TRAMADOL HCL 50 MG PO TABS: ORAL_TABLET | ORAL | 1 refills | Status: DC

## 2020-09-18 MED ORDER — AMLODIPINE BESYLATE 5 MG PO TABS: ORAL_TABLET | ORAL | 3 refills | Status: DC

## 2020-09-18 NOTE — Assessment & Plan Note (Signed)
PSA

## 2020-09-18 NOTE — Assessment & Plan Note (Signed)
Tramadol prn OTC Advil, Tylenol prn  Potential benefits of a long term opioids use as well as potential risks (i.e. addiction risk, apnea etc) and complications (i.e. Somnolence, constipation and others) were explained to the patient and were aknowledged.

## 2020-09-18 NOTE — Progress Notes (Signed)
Subjective:  Patient ID: Patrick Morgan, male    DOB: 08-28-1945  Age: 75 y.o. MRN: [id number]  CC: Annual Exam   HPI Patrick Morgan presents for HTN, OA, LBP f/u SBP 130 at home  Outpatient Medications Prior to Visit  Medication Sig Dispense Refill  . amLODipine (NORVASC) 5 MG tablet TAKE 1 TABLET(5 MG) BY MOUTH DAILY 90 tablet 2  . aspirin 81 MG tablet Take 81 mg by mouth daily.    Marland Kitchen losartan (COZAAR) 100 MG tablet TAKE 1 TABLET(100 MG) BY MOUTH DAILY 90 tablet 2  . metoprolol tartrate (LOPRESSOR) 25 MG tablet TAKE 1 TABLET(25 MG) BY MOUTH TWICE DAILY 180 tablet 2  . Multiple Vitamin (MULTIVITAMIN) tablet Take 1 tablet by mouth daily.    . traMADol (ULTRAM) 50 MG tablet TAKE 1 TO 2 TABLETS BY MOUTH TWICE DAILY AS NEEDED 120 tablet 1  . VITAMIN D PO Take 2,000 Units by mouth daily.     No facility-administered medications prior to visit.    ROS: Review of Systems  Constitutional: Negative for appetite change, fatigue and unexpected weight change.  HENT: Negative for congestion, nosebleeds, sneezing, sore throat and trouble swallowing.   Eyes: Negative for itching and visual disturbance.  Respiratory: Negative for cough.   Cardiovascular: Negative for chest pain, palpitations and leg swelling.  Gastrointestinal: Negative for abdominal distention, blood in stool, diarrhea and nausea.  Genitourinary: Negative for frequency and hematuria.  Musculoskeletal: Positive for back pain. Negative for gait problem, joint swelling and neck pain.  Skin: Negative for rash.  Neurological: Negative for dizziness, tremors, speech difficulty and weakness.  Psychiatric/Behavioral: Negative for agitation, dysphoric mood and sleep disturbance. The patient is not nervous/anxious.     Objective:  BP (!) 158/70 (BP Location: Left Arm)   Pulse 81   Temp 98.8 F (37.1 C) (Oral)   Ht 5' 10.5" (1.791 m)   Wt 204 lb 3.2 oz (92.6 kg)   SpO2 96%   BMI 28.89 kg/m   BP Readings from Last 3  Encounters:  09/18/20 (!) 158/70  10/27/19 (!) 155/69  10/17/18 137/68    Wt Readings from Last 3 Encounters:  09/18/20 204 lb 3.2 oz (92.6 kg)  10/27/19 203 lb (92.1 kg)  10/17/18 216 lb (98 kg)    Physical Exam Constitutional:      General: He is not in acute distress.    Appearance: He is well-developed.     Comments: NAD  Eyes:     Conjunctiva/sclera: Conjunctivae normal.     Pupils: Pupils are equal, round, and reactive to light.  Neck:     Thyroid: No thyromegaly.     Vascular: No JVD.  Cardiovascular:     Rate and Rhythm: Normal rate and regular rhythm.     Heart sounds: Normal heart sounds. No murmur heard. No friction rub. No gallop.   Pulmonary:     Effort: Pulmonary effort is normal. No respiratory distress.     Breath sounds: Normal breath sounds. No wheezing or rales.  Chest:     Chest wall: No tenderness.  Abdominal:     General: Bowel sounds are normal. There is no distension.     Palpations: Abdomen is soft. There is no mass.     Tenderness: There is no abdominal tenderness. There is no guarding or rebound.  Musculoskeletal:        General: No tenderness. Normal range of motion.     Cervical back: Normal range of motion.  Lymphadenopathy:     Cervical: No cervical adenopathy.  Skin:    General: Skin is warm and dry.     Findings: No rash.  Neurological:     Mental Status: He is alert and oriented to person, place, and time.     Cranial Nerves: No cranial nerve deficit.     Motor: No abnormal muscle tone.     Coordination: Coordination normal.     Gait: Gait normal.     Deep Tendon Reflexes: Reflexes are normal and symmetric.  Psychiatric:        Behavior: Behavior normal.        Thought Content: Thought content normal.        Judgment: Judgment normal.   No bruit Pt declined rectal exam  Lab Results  Component Value Date   WBC 6.4 04/20/2018   HGB 15.2 04/20/2018   HCT 43.4 04/20/2018   PLT 192.0 04/20/2018   GLUCOSE 108 (H) 04/20/2018    CHOL 173 04/20/2018   TRIG 80.0 04/20/2018   HDL 43.90 04/20/2018   LDLCALC 113 (H) 04/20/2018   ALT 23 04/20/2018   AST 20 04/20/2018   NA 139 04/20/2018   K 4.0 04/20/2018   CL 105 04/20/2018   CREATININE 0.92 04/20/2018   BUN 24 (H) 04/20/2018   CO2 24 04/20/2018   TSH 1.33 04/20/2018   PSA 2.38 04/20/2018   HGBA1C 4.8 04/07/2017    DG Chest 2 View  Addendum Date: 06/08/2015   ADDENDUM REPORT: 06/08/2015 10:30 ADDENDUM: Impression should read:  No edema or consolidation. Electronically Signed   By: Lowella Grip III M.D.   On: 06/08/2015 10:30  Result Date: 06/08/2015 CLINICAL DATA:  Cardiac palpitations for 2 weeks.  Hypertension. EXAM: CHEST  2 VIEW COMPARISON:  None. FINDINGS: Lungs are clear. Heart size and pulmonary vascularity are normal. No adenopathy. There is degenerative change thoracic spine. IMPRESSION: Edema or consolidation. Electronically Signed: By: Lowella Grip III M.D. On: 06/08/2015 10:26    Assessment & Plan:    Walker Kehr, MD

## 2020-09-18 NOTE — Assessment & Plan Note (Signed)
Chronic - nl BP at home BP 130-140/70 at home Losartan, Metoprolol, Amlodipine NAS diet A  cardiac CT scan for calcium scoring offered

## 2020-09-18 NOTE — Patient Instructions (Signed)
Cardiac CT calcium scoring test $99 Tel # is [phone]   Computed tomography, more commonly known as a CT or CAT scan, is a diagnostic medical imaging test. Like traditional x-rays, it produces multiple images or pictures of the inside of the body. The cross-sectional images generated during a CT scan can be reformatted in multiple planes. They can even generate three-dimensional images. These images can be viewed on a computer monitor, printed on film or by a 3D printer, or transferred to a CD or DVD. CT images of internal organs, bones, soft tissue and blood vessels provide greater detail than traditional x-rays, particularly of soft tissues and blood vessels. A cardiac CT scan for coronary calcium is a non-invasive way of obtaining information about the presence, location and extent of calcified plaque in the coronary arteries--the vessels that supply oxygen-containing blood to the heart muscle. Calcified plaque results when there is a build-up of fat and other substances under the inner layer of the artery. This material can calcify which signals the presence of atherosclerosis, a disease of the vessel wall, also called coronary artery disease (CAD). People with this disease have an increased risk for heart attacks. In addition, over time, progression of plaque build up (CAD) can narrow the arteries or even close off blood flow to the heart. The result may be chest pain, sometimes called "angina," or a heart attack. Because calcium is a marker of CAD, the amount of calcium detected on a cardiac CT scan is a helpful prognostic tool. The findings on cardiac CT are expressed as a calcium score. Another name for this test is coronary artery calcium scoring.  What are some common uses of the procedure? The goal of cardiac CT scan for calcium scoring is to determine if CAD is present and to what extent, even if there are no symptoms. It is a screening study that may be recommended by a physician for  patients with risk factors for CAD but no clinical symptoms. The major risk factors for CAD are: . high blood cholesterol levels  . family history of heart attacks  . diabetes  . high blood pressure  . cigarette smoking  . overweight or obese  . physical inactivity   A negative cardiac CT scan for calcium scoring shows no calcification within the coronary arteries. This suggests that CAD is absent or so minimal it cannot be seen by this technique. The chance of having a heart attack over the next two to five years is very low under these circumstances. A positive test means that CAD is present, regardless of whether or not the patient is experiencing any symptoms. The amount of calcification--expressed as the calcium score--may help to predict the likelihood of a myocardial infarction (heart attack) in the coming years and helps your medical doctor or cardiologist decide whether the patient may need to take preventive medicine or undertake other measures such as diet and exercise to lower the risk for heart attack. The extent of CAD is graded according to your calcium score:  Calcium Score  Presence of CAD (coronary artery disease)  0 No evidence of CAD   1-10 Minimal evidence of CAD  11-100 Mild evidence of CAD  101-400 Moderate evidence of CAD  Over 400 Extensive evidence of CAD

## 2020-11-03 ENCOUNTER — Encounter: Payer: Self-pay | Admitting: Physician Assistant

## 2020-11-03 NOTE — Progress Notes (Signed)
Virtual Visit via Telephone Note   This visit type was conducted due to national recommendations for restrictions regarding the COVID-19 Pandemic (e.g. social distancing) in an effort to limit this patient's exposure and mitigate transmission in our community.  Due to his co-morbid illnesses, this patient is at least at moderate risk for complications without adequate follow up.  This format is felt to be most appropriate for this patient at this time.  The patient did not have access to video technology/had technical difficulties with video requiring transitioning to audio format only (telephone).  All issues noted in this document were discussed and addressed.  No physical exam could be performed with this format.  Please refer to the patient's chart for his  consent to telehealth for [facility].   The patient was identified using 2 identifiers.  Date:  11/04/2020   ID:  Patrick Morgan, DOB [birth year], MRN [id number]  Patient Location: Home Provider Location: Office/Clinic  PCP:  Cassandria Anger, MD  Cardiologist:  Lauree Chandler, MD  Electrophysiologist:  None   Evaluation Performed:  Follow-Up Visit  Chief Complaint:  F/u PVCs  History of Present Illness:    Patrick Morgan is a 75 y.o. male with HTN, palpitations (PVCs, PACs), vertigo, HLD (by labs managed by primary care) presents for virtual follow-up. He has previously followed with Dr. Angelena Form for evaluation of palpitations. 2D echo in 2016 showed normal LV function and no other significant findings. Event monitor in 2016 showed NSR, rare PVCs, frequent PACs. He has been treated with beta blocker since then.   He presents back for follow-up today overall doing well without any cardiac complaints. No CP, SOB, palpitations, dizziness or syncope. Initial reported BP 144/65 with recheck 135/64. He is compliant with all of his medicines and takes his BP meds around 6:30am. He has no other concerns today. His PCP had  recently offered coronary calcium scoring but he is still thinking about it.  Labs Independently Reviewed 08/2020 TSH wnl, Hgb normal, LDL 135 (PCP), K 4.5, Cr 0.86, LFTs wnl  Past Medical History:  Diagnosis Date  . Hypertension   . PAC (premature atrial contraction)   . PVC's (premature ventricular contractions)   . Vertigo    Past Surgical History:  Procedure Laterality Date  . NO PAST SURGERIES       Current Meds  Medication Sig  . amLODipine (NORVASC) 5 MG tablet TAKE 1 TABLET(5 MG) BY MOUTH DAILY  . aspirin 81 MG tablet Take 81 mg by mouth daily.  Marland Kitchen losartan (COZAAR) 100 MG tablet TAKE 1 TABLET(100 MG) BY MOUTH DAILY  . metoprolol tartrate (LOPRESSOR) 25 MG tablet TAKE 1 TABLET(25 MG) BY MOUTH TWICE DAILY  . Multiple Vitamin (MULTIVITAMIN) tablet Take 1 tablet by mouth daily.  . traMADol (ULTRAM) 50 MG tablet Take 1-2 tablets by mouth daily for pain  . VITAMIN D PO Take 2,000 Units by mouth daily.     Allergies:   Codeine and Telmisartan   Social History   Tobacco Use  . Smoking status: Former Research scientist (life sciences)  . Smokeless tobacco: Never Used  . Tobacco comment: quit 1975  Vaping Use  . Vaping Use: Never used  Substance Use Topics  . Alcohol use: No    Alcohol/week: 0.0 standard drinks    Comment: quit 2002  . Drug use: No     Family Hx: The patient's family history includes Alzheimer's disease in his mother; Cancer (age of onset: 49) in his father; Diabetes in  his father.  ROS:   Please see the history of present illness.    All other systems reviewed and are negative.   Prior CV studies:   The following studies were reviewed today:  2d echo 2016 - Left ventricle: The cavity size was normal. Systolic function was  normal. The estimated ejection fraction was in the range of 60%  to 65%. Wall motion was normal; there were no regional wall  motion abnormalities.  - Atrial septum: No defect or patent foramen ovale was identified.  Monitor 2016  Sinus  rhythm.  Rare premature ventricular contractions (PVCs) Frequent premature atrial contractions (PACs) No atrial fibrillation.      Labs/Other Tests and Data Reviewed:    EKG:  An ECG dated 09/08/17 was personally reviewed today and demonstrated:  NSR no acute changes    Recent Labs: 09/18/2020: ALT 19; BUN 19; Creatinine, Ser 0.86; Hemoglobin 15.6; Platelets 190.0; Potassium 4.5; Sodium 141; TSH 1.23   Recent Lipid Panel Lab Results  Component Value Date/Time   CHOL 203 (H) 09/18/2020 10:33 AM   TRIG 75.0 09/18/2020 10:33 AM   HDL 52.90 09/18/2020 10:33 AM   CHOLHDL 4 09/18/2020 10:33 AM   LDLCALC 135 (H) 09/18/2020 10:33 AM    Wt Readings from Last 3 Encounters:  11/04/20 201 lb (91.2 kg)  09/18/20 204 lb 3.2 oz (92.6 kg)  10/27/19 203 lb (92.1 kg)     Objective:    Vital Signs:  BP (!) 144/65   Pulse 61   Ht 5' 10.5" (1.791 m)   Wt 201 lb (91.2 kg)   BMI 28.43 kg/m    VS reviewed. General - male in no acute distress Pulm - No labored breathing, no coughing during visit, no audible wheezing, speaking in full sentences Neuro - A+Ox3, no slurred speech, answers questions appropriately Psych - Pleasant affect  ASSESSMENT & PLAN:    1. Palpitations with prior PACs/PVCs - quiescent on metoprolol, would continue metoprolol. He will notify for any new symptoms. Would recommend next OV be in-person to be able to update EKG.  2. Essential HTN - Suboptimal blood pressure control noted today. We need a better idea of what it's running at home. The patient was provided instructions on monitoring blood pressure at home for 4-5 days and relaying results to our office. Per our discussion, I helped him reset a temporary password for his MyChart account today. If SBP remains >093 systolic at home would consider increasing amlodipine to 10mg  daily. Changing metoprolol to carvedilol could be another option for future rerer  3. Hyperlipidemia - followed by primary care. Patient was  offered coronary calcium scoring by primary care to help infer decision-making about cholesterol goals. He wishes to defer. He will let us know if he changes his mind.   Time:   Today, I have spent 12 minutes with the patient with telehealth technology discussing the above problems.     Medication Adjustments/Labs and Tests Ordered: Current medicines are reviewed at length with the patient today.  Testing and concerns regarding medicines are outlined above.    Follow Up: In Person with Dr. Angelena Form in 1 year.  Signed, Charlie Pitter, PA-C  11/04/2020 10:06 AM    [place]

## 2020-11-04 ENCOUNTER — Telehealth (INDEPENDENT_AMBULATORY_CARE_PROVIDER_SITE_OTHER): Payer: Medicare Other | Admitting: Physician Assistant

## 2020-11-04 ENCOUNTER — Encounter: Payer: Self-pay | Admitting: Physician Assistant

## 2020-11-04 ENCOUNTER — Telehealth: Payer: Self-pay | Admitting: *Deleted

## 2020-11-04 VITALS — BP 144/65 | HR 61 | Ht 70.5 in | Wt 201.0 lb

## 2020-11-04 DIAGNOSIS — I493 Ventricular premature depolarization: Secondary | ICD-10-CM | POA: Diagnosis not present

## 2020-11-04 DIAGNOSIS — E785 Hyperlipidemia, unspecified: Secondary | ICD-10-CM

## 2020-11-04 DIAGNOSIS — I10 Essential (primary) hypertension: Secondary | ICD-10-CM | POA: Diagnosis not present

## 2020-11-04 DIAGNOSIS — R002 Palpitations: Secondary | ICD-10-CM

## 2020-11-04 DIAGNOSIS — I491 Atrial premature depolarization: Secondary | ICD-10-CM

## 2020-11-04 MED ORDER — METOPROLOL TARTRATE 25 MG PO TABS: ORAL_TABLET | ORAL | 3 refills | Status: DC

## 2020-11-04 NOTE — Telephone Encounter (Signed)
  Patient Consent for Virtual Visit         Patrick Morgan has provided verbal consent on 11/04/2020 for a virtual visit (video or telephone).   CONSENT FOR VIRTUAL VISIT FOR:  Patrick Morgan  By participating in this virtual visit I agree to the following:  I hereby voluntarily request, consent and authorize [facility] and its employed or contracted physicians, physician assistants, nurse practitioners or other licensed health care professionals (the Practitioner), to provide me with telemedicine health care services (the "Services") as deemed necessary by the treating Practitioner. I acknowledge and consent to receive the Services by the Practitioner via telemedicine. I understand that the telemedicine visit will involve communicating with the Practitioner through live audiovisual communication technology and the disclosure of certain medical information by electronic transmission. I acknowledge that I have been given the opportunity to request an in-person assessment or other available alternative prior to the telemedicine visit and am voluntarily participating in the telemedicine visit.  I understand that I have the right to withhold or withdraw my consent to the use of telemedicine in the course of my care at any time, without affecting my right to future care or treatment, and that the Practitioner or I may terminate the telemedicine visit at any time. I understand that I have the right to inspect all information obtained and/or recorded in the course of the telemedicine visit and may receive copies of available information for a reasonable fee.  I understand that some of the potential risks of receiving the Services via telemedicine include:  Marland Kitchen Delay or interruption in medical evaluation due to technological equipment failure or disruption; . Information transmitted may not be sufficient (e.g. poor resolution of images) to allow for appropriate medical decision making by the Practitioner;  and/or  . In rare instances, security protocols could fail, causing a breach of personal health information.  Furthermore, I acknowledge that it is my responsibility to provide information about my medical history, conditions and care that is complete and accurate to the best of my ability. I acknowledge that Practitioner's advice, recommendations, and/or decision may be based on factors not within their control, such as incomplete or inaccurate data provided by me or distortions of diagnostic images or specimens that may result from electronic transmissions. I understand that the practice of medicine is not an exact science and that Practitioner makes no warranties or guarantees regarding treatment outcomes. I acknowledge that a copy of this consent can be made available to me via my patient portal ([facility]), or I can request a printed copy by calling the office of [facility].    I understand that my insurance will be billed for this visit.   I have read or had this consent read to me. . I understand the contents of this consent, which adequately explains the benefits and risks of the Services being provided via telemedicine.  . I have been provided ample opportunity to ask questions regarding this consent and the Services and have had my questions answered to my satisfaction. . I give my informed consent for the services to be provided through the use of telemedicine in my medical care

## 2020-11-04 NOTE — Patient Instructions (Addendum)
Medication Instructions:  Your physician recommends that you continue on your current medications as directed. Please refer to the Current Medication list given to you today.  *If you need a refill on your cardiac medications before your next appointment, please call your pharmacy*   Lab Work: None ordered  If you have labs (blood work) drawn today and your tests are completely normal, you will receive your results only by: Marland Kitchen MyChart Message (if you have MyChart) OR . A paper copy in the mail If you have any lab test that is abnormal or we need to change your treatment, we will call you to review the results.   Testing/Procedures: None ordered   Follow-Up: At [facility], you and your health needs are our priority.  As part of our continuing mission to provide you with exceptional heart care, we have created designated Provider Care Teams.  These Care Teams include your primary Cardiologist (physician) and Advanced Practice Providers (APPs -  Physician Assistants and Nurse Practitioners) who all work together to provide you with the care you need, when you need it.  We recommend signing up for the patient portal called "MyChart".  Sign up information is provided on this After Visit Summary.  MyChart is used to connect with patients for Virtual Visits (Telemedicine).  Patients are able to view lab/test results, encounter notes, upcoming appointments, etc.  Non-urgent messages can be sent to your provider as well.   To learn more about what you can do with MyChart, go to [email or web address].    Your next appointment:   12 month(s)  The format for your next appointment:   In Person  Provider:   You may see Lauree Chandler, MD or one of the following Advanced Practice Providers on your designated Care Team:    Melina Copa, PA-C  Ermalinda Barrios, PA-C    Other Instructions  I would recommend using a blood pressure cuff that goes on your arm. The wrist ones can be  inaccurate. If possible, try to select one that also reports your heart rate. To check your blood pressure, choose a time in the middle of the day (at least 3 hours after taking your blood pressure medicines). If you can sample it at different times of the day, that's great - it might give you more information about how your blood pressure fluctuates. Remain seated in a chair for 5 minutes quietly beforehand, then check it. Please record a list of those readings and call us/send in MyChart message with them for our review in 4-5 days.   Let us know if you decide you want to pursue the coronary calcium score.

## 2021-01-14 DIAGNOSIS — Z23 Encounter for immunization: Secondary | ICD-10-CM | POA: Diagnosis not present

## 2021-03-18 ENCOUNTER — Ambulatory Visit (INDEPENDENT_AMBULATORY_CARE_PROVIDER_SITE_OTHER): Payer: Medicare Other | Admitting: Internal Medicine

## 2021-03-18 ENCOUNTER — Other Ambulatory Visit: Payer: Self-pay

## 2021-03-18 ENCOUNTER — Encounter: Payer: Self-pay | Admitting: Internal Medicine

## 2021-03-18 DIAGNOSIS — M5440 Lumbago with sciatica, unspecified side: Secondary | ICD-10-CM

## 2021-03-18 DIAGNOSIS — I10 Essential (primary) hypertension: Secondary | ICD-10-CM | POA: Diagnosis not present

## 2021-03-18 DIAGNOSIS — R739 Hyperglycemia, unspecified: Secondary | ICD-10-CM

## 2021-03-18 LAB — COMPREHENSIVE METABOLIC PANEL
ALT: 18 U/L (ref 0–53)
AST: 18 U/L (ref 0–37)
Albumin: 4.4 g/dL (ref 3.5–5.2)
Alkaline Phosphatase: 59 U/L (ref 39–117)
BUN: 17 mg/dL (ref 6–23)
CO2: 26 mEq/L (ref 19–32)
Calcium: 9.8 mg/dL (ref 8.4–10.5)
Chloride: 106 mEq/L (ref 96–112)
Creatinine, Ser: 0.85 mg/dL (ref 0.40–1.50)
GFR: 85.29 mL/min (ref >60.00)
Glucose, Bld: 104 mg/dL — ABNORMAL HIGH (ref 70–99)
Potassium: 4 mEq/L (ref 3.5–5.1)
Sodium: 141 mEq/L (ref 135–145)
Total Bilirubin: 0.8 mg/dL (ref 0.2–1.2)
Total Protein: 7.8 g/dL (ref 6.0–8.3)

## 2021-03-18 LAB — HEMOGLOBIN A1C: Hgb A1c MFr Bld: 5 % (ref 4.6–6.5)

## 2021-03-18 NOTE — Addendum Note (Signed)
Addended by: Jacobo Forest on: 03/18/2021 08:55 AM   Modules accepted: Orders

## 2021-03-18 NOTE — Progress Notes (Signed)
Subjective:  Patient ID: Patrick Morgan, male    DOB: Apr 21, 1946  Age: 75 y.o. MRN: [id number]  CC: Follow-up (6 month f/u)   HPI Patrick Morgan presents for HTN, LBP, hyperglycemia. BP ok at home with SBP<130-140  Outpatient Medications Prior to Visit  Medication Sig Dispense Refill   amLODipine (NORVASC) 5 MG tablet TAKE 1 TABLET(5 MG) BY MOUTH DAILY 90 tablet 3   aspirin 81 MG tablet Take 81 mg by mouth daily.     losartan (COZAAR) 100 MG tablet TAKE 1 TABLET(100 MG) BY MOUTH DAILY 90 tablet 3   metoprolol tartrate (LOPRESSOR) 25 MG tablet TAKE 1 TABLET(25 MG) BY MOUTH TWICE DAILY 180 tablet 3   Multiple Vitamin (MULTIVITAMIN) tablet Take 1 tablet by mouth daily.     traMADol (ULTRAM) 50 MG tablet Take 1-2 tablets by mouth daily for pain 120 tablet 1   VITAMIN D PO Take 2,000 Units by mouth daily.     No facility-administered medications prior to visit.    ROS: Review of Systems  Constitutional:  Negative for appetite change, fatigue and unexpected weight change.  HENT:  Negative for congestion, nosebleeds, sneezing, sore throat and trouble swallowing.   Eyes:  Negative for itching and visual disturbance.  Respiratory:  Negative for cough.   Cardiovascular:  Negative for chest pain, palpitations and leg swelling.  Gastrointestinal:  Negative for abdominal distention, blood in stool, diarrhea and nausea.  Genitourinary:  Negative for frequency and hematuria.  Musculoskeletal:  Positive for back pain. Negative for gait problem, joint swelling and neck pain.  Skin:  Negative for rash.  Neurological:  Negative for dizziness, tremors, speech difficulty and weakness.  Psychiatric/Behavioral:  Negative for agitation, dysphoric mood, sleep disturbance and suicidal ideas. The patient is not nervous/anxious.    Objective:  BP (!) 152/70 (BP Location: Left Arm)   Pulse 67   Temp 98 F (36.7 C) (Oral)   Ht 5' 10.5" (1.791 m)   Wt 202 lb (91.6 kg)   SpO2 98%   BMI 28.57 kg/m    BP Readings from Last 3 Encounters:  03/18/21 (!) 152/70  11/04/20 (!) 144/65  09/18/20 (!) 158/70    Wt Readings from Last 3 Encounters:  03/18/21 202 lb (91.6 kg)  11/04/20 201 lb (91.2 kg)  09/18/20 204 lb 3.2 oz (92.6 kg)    Physical Exam Constitutional:      General: He is not in acute distress.    Appearance: He is well-developed.     Comments: NAD  Eyes:     Conjunctiva/sclera: Conjunctivae normal.     Pupils: Pupils are equal, round, and reactive to light.  Neck:     Thyroid: No thyromegaly.     Vascular: No JVD.  Cardiovascular:     Rate and Rhythm: Normal rate and regular rhythm.     Heart sounds: Normal heart sounds. No murmur heard.   No friction rub. No gallop.  Pulmonary:     Effort: Pulmonary effort is normal. No respiratory distress.     Breath sounds: Normal breath sounds. No wheezing or rales.  Chest:     Chest wall: No tenderness.  Abdominal:     General: Bowel sounds are normal. There is no distension.     Palpations: Abdomen is soft. There is no mass.     Tenderness: There is no abdominal tenderness. There is no guarding or rebound.  Musculoskeletal:        General: Tenderness present. Normal range of  motion.     Cervical back: Normal range of motion.  Lymphadenopathy:     Cervical: No cervical adenopathy.  Skin:    General: Skin is warm and dry.     Findings: No rash.  Neurological:     Mental Status: He is alert and oriented to person, place, and time.     Cranial Nerves: No cranial nerve deficit.     Motor: No abnormal muscle tone.     Coordination: Coordination normal.     Gait: Gait normal.     Deep Tendon Reflexes: Reflexes are normal and symmetric.  Psychiatric:        Behavior: Behavior normal.        Thought Content: Thought content normal.        Judgment: Judgment normal.  LS tender w/ROM  Lab Results  Component Value Date   WBC 6.7 09/18/2020   HGB 15.6 09/18/2020   HCT 44.9 09/18/2020   PLT 190.0 09/18/2020   GLUCOSE  104 (H) 09/18/2020   CHOL 203 (H) 09/18/2020   TRIG 75.0 09/18/2020   HDL 52.90 09/18/2020   LDLCALC 135 (H) 09/18/2020   ALT 19 09/18/2020   AST 20 09/18/2020   NA 141 09/18/2020   K 4.5 09/18/2020   CL 105 09/18/2020   CREATININE 0.86 09/18/2020   BUN 19 09/18/2020   CO2 26 09/18/2020   TSH 1.23 09/18/2020   PSA 2.88 09/18/2020   HGBA1C 4.8 04/07/2017    DG Chest 2 View  Addendum Date: 06/08/2015   ADDENDUM REPORT: 06/08/2015 10:30 ADDENDUM: Impression should read:  No edema or consolidation. Electronically Signed   By: Lowella Grip III M.D.   On: 06/08/2015 10:30  Result Date: 06/08/2015 CLINICAL DATA:  Cardiac palpitations for 2 weeks.  Hypertension. EXAM: CHEST  2 VIEW COMPARISON:  None. FINDINGS: Lungs are clear. Heart size and pulmonary vascularity are normal. No adenopathy. There is degenerative change thoracic spine. IMPRESSION: Edema or consolidation. Electronically Signed: By: Lowella Grip III M.D. On: 06/08/2015 10:26    Assessment & Plan:   Problem List Items Addressed This Visit     Hyperglycemia    Keep wt down Check A1c      Relevant Orders   Comprehensive metabolic panel   Hemoglobin A1c   Hypertension     BP ok at home with SBP<130-140 Cont on Losartan, Metoprolol, Amlodipine      Relevant Orders   Comprehensive metabolic panel   Low back pain    Tramadol prn Using a back brace  OTC Advil, Tylenol prn  Potential benefits of a long term opioids use as well as potential risks (i.e. addiction risk, apnea etc) and complications (i.e. Somnolence, constipation and others) were explained to the patient and were aknowledged.         Walker Kehr, MD

## 2021-03-18 NOTE — Assessment & Plan Note (Signed)
Tramadol prn Using a back brace  OTC Advil, Tylenol prn  Potential benefits of a long term opioids use as well as potential risks (i.e. addiction risk, apnea etc) and complications (i.e. Somnolence, constipation and others) were explained to the patient and were aknowledged.

## 2021-03-18 NOTE — Assessment & Plan Note (Signed)
Keep wt down Check A1c

## 2021-03-18 NOTE — Assessment & Plan Note (Signed)
BP ok at home with SBP<130-140 Cont on Losartan, Metoprolol, Amlodipine

## 2021-04-02 ENCOUNTER — Other Ambulatory Visit: Payer: Self-pay | Admitting: Internal Medicine

## 2021-04-04 DIAGNOSIS — Z23 Encounter for immunization: Secondary | ICD-10-CM | POA: Diagnosis not present

## 2021-09-17 ENCOUNTER — Other Ambulatory Visit: Payer: Self-pay

## 2021-09-17 ENCOUNTER — Encounter: Payer: Self-pay | Admitting: Internal Medicine

## 2021-09-17 ENCOUNTER — Ambulatory Visit (INDEPENDENT_AMBULATORY_CARE_PROVIDER_SITE_OTHER): Payer: Medicare Other | Admitting: Internal Medicine

## 2021-09-17 DIAGNOSIS — R739 Hyperglycemia, unspecified: Secondary | ICD-10-CM

## 2021-09-17 DIAGNOSIS — R972 Elevated prostate specific antigen [PSA]: Secondary | ICD-10-CM | POA: Diagnosis not present

## 2021-09-17 DIAGNOSIS — I10 Essential (primary) hypertension: Secondary | ICD-10-CM | POA: Diagnosis not present

## 2021-09-17 DIAGNOSIS — M5440 Lumbago with sciatica, unspecified side: Secondary | ICD-10-CM

## 2021-09-17 LAB — COMPREHENSIVE METABOLIC PANEL
ALT: 18 U/L (ref 0–53)
AST: 21 U/L (ref 0–37)
Albumin: 4.7 g/dL (ref 3.5–5.2)
Alkaline Phosphatase: 68 U/L (ref 39–117)
BUN: 13 mg/dL (ref 6–23)
CO2: 26 mEq/L (ref 19–32)
Calcium: 9.9 mg/dL (ref 8.4–10.5)
Chloride: 102 mEq/L (ref 96–112)
Creatinine, Ser: 0.93 mg/dL (ref 0.40–1.50)
GFR: 80.31 mL/min (ref >60.00)
Glucose, Bld: 105 mg/dL — ABNORMAL HIGH (ref 70–99)
Potassium: 4.1 mEq/L (ref 3.5–5.1)
Sodium: 138 mEq/L (ref 135–145)
Total Bilirubin: 0.9 mg/dL (ref 0.2–1.2)
Total Protein: 7.4 g/dL (ref 6.0–8.3)

## 2021-09-17 LAB — PSA: PSA: 2.67 ng/mL (ref 0.10–4.00)

## 2021-09-17 LAB — HEMOGLOBIN A1C: Hgb A1c MFr Bld: 4.9 % (ref 4.6–6.5)

## 2021-09-17 MED ORDER — METOPROLOL TARTRATE 25 MG PO TABS: ORAL_TABLET | ORAL | 3 refills | Status: DC

## 2021-09-17 MED ORDER — TRAMADOL HCL 50 MG PO TABS: ORAL_TABLET | ORAL | 3 refills | Status: DC

## 2021-09-17 MED ORDER — AMLODIPINE BESYLATE 5 MG PO TABS: ORAL_TABLET | ORAL | 3 refills | Status: DC

## 2021-09-17 MED ORDER — LOSARTAN POTASSIUM 100 MG PO TABS: ORAL_TABLET | ORAL | 3 refills | Status: DC

## 2021-09-17 NOTE — Progress Notes (Signed)
? ?Subjective:  ?Patient ID: Patrick Morgan, male    DOB: [birth year]  Age: 76 y.o. MRN: [id number] ? ?CC: Follow-up (6 month follow up) ? ? ?HPI ?Patrick Morgan presents for HTN, OA/LBP, elevated PSA f/u ? ?Outpatient Medications Prior to Visit  ?Medication Sig Dispense Refill  ? aspirin 81 MG tablet Take 81 mg by mouth daily.    ? Multiple Vitamin (MULTIVITAMIN) tablet Take 1 tablet by mouth daily.    ? VITAMIN D PO Take 2,000 Units by mouth daily.    ? amLODipine (NORVASC) 5 MG tablet TAKE 1 TABLET(5 MG) BY MOUTH DAILY 90 tablet 3  ? losartan (COZAAR) 100 MG tablet TAKE 1 TABLET(100 MG) BY MOUTH DAILY 90 tablet 3  ? metoprolol tartrate (LOPRESSOR) 25 MG tablet TAKE 1 TABLET(25 MG) BY MOUTH TWICE DAILY 180 tablet 3  ? traMADol (ULTRAM) 50 MG tablet TAKE 1 TO 2 TABLETS BY MOUTH DAILY FOR PAIN 120 tablet 3  ? ?No facility-administered medications prior to visit.  ? ? ?ROS: ?Review of Systems  ?Constitutional:  Negative for appetite change, fatigue and unexpected weight change.  ?HENT:  Negative for congestion, nosebleeds, sneezing, sore throat and trouble swallowing.   ?Eyes:  Negative for itching and visual disturbance.  ?Respiratory:  Negative for cough.   ?Cardiovascular:  Negative for chest pain, palpitations and leg swelling.  ?Gastrointestinal:  Negative for abdominal distention, blood in stool, diarrhea and nausea.  ?Genitourinary:  Negative for frequency and hematuria.  ?Musculoskeletal:  Positive for back pain. Negative for gait problem, joint swelling and neck pain.  ?Skin:  Negative for rash.  ?Neurological:  Negative for dizziness, tremors, speech difficulty and weakness.  ?Psychiatric/Behavioral:  Negative for agitation, dysphoric mood, sleep disturbance and suicidal ideas. The patient is not nervous/anxious.   ? ?Objective:  ?BP (!) 158/80 (BP Location: Left Arm, Patient Position: Sitting, Cuff Size: Normal)   Pulse 73   Temp 98.1 ?F (36.7 ?C) (Oral)   Ht 5' 10.5" (1.791 m)   Wt 206 lb (93.4 kg)    SpO2 97%   BMI 29.14 kg/m?  ? ?BP Readings from Last 3 Encounters:  ?09/17/21 (!) 158/80  ?03/18/21 (!) 152/70  ?11/04/20 (!) 144/65  ? ? ?Wt Readings from Last 3 Encounters:  ?09/17/21 206 lb (93.4 kg)  ?03/18/21 202 lb (91.6 kg)  ?11/04/20 201 lb (91.2 kg)  ? ? ?Physical Exam ?Constitutional:   ?   General: He is not in acute distress. ?   Appearance: He is well-developed.  ?   Comments: NAD  ?Eyes:  ?   Conjunctiva/sclera: Conjunctivae normal.  ?   Pupils: Pupils are equal, round, and reactive to light.  ?Neck:  ?   Thyroid: No thyromegaly.  ?   Vascular: No JVD.  ?Cardiovascular:  ?   Rate and Rhythm: Normal rate and regular rhythm.  ?   Heart sounds: Normal heart sounds. No murmur heard. ?  No friction rub. No gallop.  ?Pulmonary:  ?   Effort: Pulmonary effort is normal. No respiratory distress.  ?   Breath sounds: Normal breath sounds. No wheezing or rales.  ?Chest:  ?   Chest wall: No tenderness.  ?Abdominal:  ?   General: Bowel sounds are normal. There is no distension.  ?   Palpations: Abdomen is soft. There is no mass.  ?   Tenderness: There is no abdominal tenderness. There is no guarding or rebound.  ?Musculoskeletal:     ?   General: No tenderness.  Normal range of motion.  ?   Cervical back: Normal range of motion.  ?Lymphadenopathy:  ?   Cervical: No cervical adenopathy.  ?Skin: ?   General: Skin is warm and dry.  ?   Findings: No rash.  ?Neurological:  ?   Mental Status: He is alert and oriented to person, place, and time.  ?   Cranial Nerves: No cranial nerve deficit.  ?   Motor: No abnormal muscle tone.  ?   Coordination: Coordination normal.  ?   Gait: Gait normal.  ?   Deep Tendon Reflexes: Reflexes are normal and symmetric.  ?Psychiatric:     ?   Behavior: Behavior normal.     ?   Thought Content: Thought content normal.     ?   Judgment: Judgment normal.  ? ? ?Lab Results  ?Component Value Date  ? WBC 6.7 09/18/2020  ? HGB 15.6 09/18/2020  ? HCT 44.9 09/18/2020  ? PLT 190.0 09/18/2020  ?  GLUCOSE 104 (H) 03/18/2021  ? CHOL 203 (H) 09/18/2020  ? TRIG 75.0 09/18/2020  ? HDL 52.90 09/18/2020  ? LDLCALC 135 (H) 09/18/2020  ? ALT 18 03/18/2021  ? AST 18 03/18/2021  ? NA 141 03/18/2021  ? K 4.0 03/18/2021  ? CL 106 03/18/2021  ? CREATININE 0.85 03/18/2021  ? BUN 17 03/18/2021  ? CO2 26 03/18/2021  ? TSH 1.23 09/18/2020  ? PSA 2.88 09/18/2020  ? HGBA1C 5.0 03/18/2021  ? ? ?DG Chest 2 View ? ?Addendum Date: 06/08/2015   ?ADDENDUM REPORT: 06/08/2015 10:30 ADDENDUM: Impression should read:  No edema or consolidation. Electronically Signed   By: Lowella Grip III M.D.   On: 06/08/2015 10:30 ? ?Result Date: 06/08/2015 ?CLINICAL DATA:  Cardiac palpitations for 2 weeks.  Hypertension. EXAM: CHEST  2 VIEW COMPARISON:  None. FINDINGS: Lungs are clear. Heart size and pulmonary vascularity are normal. No adenopathy. There is degenerative change thoracic spine. IMPRESSION: Edema or consolidation. Electronically Signed: By: Lowella Grip III M.D. On: 06/08/2015 10:26  ? ? ?Assessment & Plan:  ? ?Problem List Items Addressed This Visit   ? ? Hypertension  ?   BP ok at home with SBP<130-140 ?Cont on Losartan, Metoprolol, Amlodipine ?  ?  ? Relevant Medications  ? amLODipine (NORVASC) 5 MG tablet  ? losartan (COZAAR) 100 MG tablet  ? metoprolol tartrate (LOPRESSOR) 25 MG tablet  ? Other Relevant Orders  ? Comprehensive metabolic panel  ? Elevated PSA  ?  Will check PSA ?  ?  ? Relevant Orders  ? PSA  ? Low back pain  ?  Chronic ?Tramadol prn ?Using a back brace ?Add OTC Advil, Tylenol prn ? Potential benefits of a long term opioids use as well as potential risks (i.e. addiction risk, apnea etc) and complications (i.e. Somnolence, constipation and others) were explained to the patient and were aknowledged. ?Using [employer] ?  ?  ? Relevant Medications  ? traMADol (ULTRAM) 50 MG tablet  ? Hyperglycemia  ?  Will check A1c ?  ?  ? Relevant Orders  ? Comprehensive metabolic panel  ? Hemoglobin A1c  ?  ? ? ?Meds ordered  this encounter  ?Medications  ? traMADol (ULTRAM) 50 MG tablet  ?  Sig: TAKE 1 TO 2 TABLETS BY MOUTH DAILY FOR PAIN  ?  Dispense:  120 tablet  ?  Refill:  3  ? amLODipine (NORVASC) 5 MG tablet  ?  Sig: TAKE 1 TABLET(5 MG) BY  MOUTH DAILY  ?  Dispense:  90 tablet  ?  Refill:  3  ? losartan (COZAAR) 100 MG tablet  ?  Sig: TAKE 1 TABLET(100 MG) BY MOUTH DAILY  ?  Dispense:  90 tablet  ?  Refill:  3  ? metoprolol tartrate (LOPRESSOR) 25 MG tablet  ?  Sig: TAKE 1 TABLET(25 MG) BY MOUTH TWICE DAILY  ?  Dispense:  180 tablet  ?  Refill:  3  ?  ? ?Weeks every day ?Follow-up: Return in about 6 months (around 03/20/2022) for Wellness Exam. ? ?Walker Kehr, MD ?

## 2021-09-17 NOTE — Assessment & Plan Note (Signed)
Will check PSA 

## 2021-09-17 NOTE — Assessment & Plan Note (Signed)
BP ok at home with SBP<130-140 ?Cont on Losartan, Metoprolol, Amlodipine ?

## 2021-09-17 NOTE — Assessment & Plan Note (Addendum)
Chronic ?Tramadol prn ?Using a back brace ?Add OTC Advil, Tylenol prn ? Potential benefits of a long term opioids use as well as potential risks (i.e. addiction risk, apnea etc) and complications (i.e. Somnolence, constipation and others) were explained to the patient and were aknowledged. ?Using [employer] ?

## 2021-09-17 NOTE — Assessment & Plan Note (Signed)
Will check A1c

## 2021-11-29 ENCOUNTER — Other Ambulatory Visit: Payer: Self-pay | Admitting: Internal Medicine

## 2021-12-01 NOTE — Telephone Encounter (Signed)
Check [place] registry last filled 08/09/2021.Marland KitchenJohny Chess

## 2021-12-02 MED ORDER — TRAMADOL HCL 50 MG PO TABS: ORAL_TABLET | ORAL | 3 refills | Status: DC

## 2021-12-22 ENCOUNTER — Ambulatory Visit (INDEPENDENT_AMBULATORY_CARE_PROVIDER_SITE_OTHER): Payer: Medicare Other

## 2021-12-22 DIAGNOSIS — Z0000 Encounter for general adult medical examination without abnormal findings: Secondary | ICD-10-CM

## 2021-12-22 NOTE — Progress Notes (Addendum)
I connected with Patrick Morgan today by telephone and verified that I am speaking with the correct person using two identifiers. Location patient: home Location provider: work Persons participating in the virtual visit: patient, provider.   I discussed the limitations, risks, security and privacy concerns of performing an evaluation and management service by telephone and the availability of in person appointments. I also discussed with the patient that there may be a patient responsible charge related to this service. The patient expressed understanding and verbally consented to this telephonic visit.    Interactive audio and video telecommunications were attempted between this provider and patient, however failed, due to patient having technical difficulties OR patient did not have access to video capability.  We continued and completed visit with audio only.  Some vital signs may be absent or patient reported.   Time Spent with patient on telephone encounter: 30 minutes  Subjective:   Patrick Morgan is a 76 y.o. male who presents for Medicare Annual/Subsequent preventive examination.  Review of Systems     Cardiac Risk Factors include: advanced age (>89mn, >>50women);hypertension;male gender     Objective:    Today's Vitals   12/22/21 1337  PainSc: 4    There is no height or weight on file to calculate BMI.     12/22/2021    1:40 PM 06/08/2015    9:16 AM  Advanced Directives  Does Patient Have a Medical Advance Directive? Yes No  Type of Advance Directive Living will;Healthcare Power of Attorney   Does patient want to make changes to medical advance directive? No - Patient declined   Copy of [place]? No - copy requested     Current Medications (verified) Outpatient Encounter Medications as of 12/22/2021  Medication Sig   amLODipine (NORVASC) 5 MG tablet TAKE 1 TABLET(5 MG) BY MOUTH DAILY   aspirin 81 MG tablet Take 81 mg by mouth daily.    losartan (COZAAR) 100 MG tablet TAKE 1 TABLET(100 MG) BY MOUTH DAILY   metoprolol tartrate (LOPRESSOR) 25 MG tablet TAKE 1 TABLET(25 MG) BY MOUTH TWICE DAILY   Multiple Vitamin (MULTIVITAMIN) tablet Take 1 tablet by mouth daily.   traMADol (ULTRAM) 50 MG tablet TAKE 1 TO 2 TABLETS BY MOUTH DAILY FOR PAIN   VITAMIN D PO Take 2,000 Units by mouth daily.   No facility-administered encounter medications on file as of 12/22/2021.    Allergies (verified) Codeine and Telmisartan   History: Past Medical History:  Diagnosis Date   Hypertension    PAC (premature atrial contraction)    PVC's (premature ventricular contractions)    Vertigo    Past Surgical History:  Procedure Laterality Date   NO PAST SURGERIES     Family History  Problem Relation Age of Onset   Alzheimer's disease Mother    Cancer Father 860      bladder ca   Diabetes Father    Social History   Socioeconomic History   Marital status: Married    Spouse name: Not on file   Number of children: Not on file   Years of education: Not on file   Highest education level: Not on file  Occupational History   Not on file  Tobacco Use   Smoking status: Former   Smokeless tobacco: Never   Tobacco comments:    quit 1975  Vaping Use   Vaping Use: Never used  Substance and Sexual Activity   Alcohol use: No  Alcohol/week: 0.0 standard drinks of alcohol    Comment: quit 2002   Drug use: No   Sexual activity: Yes  Other Topics Concern   Not on file  Social History Narrative   Not on file   Social Determinants of Health   Financial Resource Strain: Low Risk  (12/22/2021)   Overall Financial Resource Strain (CARDIA)    Difficulty of Paying Living Expenses: Not hard at all  Food Insecurity: No Food Insecurity (12/22/2021)   Hunger Vital Sign    Worried About Running Out of Food in the Last Year: Never true    Ran Out of Food in the Last Year: Never true  Transportation Needs: No Transportation Needs (12/22/2021)    PRAPARE - Hydrologist (Medical): No    Lack of Transportation (Non-Medical): No  Physical Activity: Sufficiently Active (12/22/2021)   Exercise Vital Sign    Days of Exercise per Week: 5 days    Minutes of Exercise per Session: 30 min  Stress: No Stress Concern Present (12/22/2021)   [place]    Feeling of Stress : Not at all  Social Connections: [place] (12/22/2021)   Social Connection and Isolation Panel [NHANES]    Frequency of Communication with Friends and Family: More than three times a week    Frequency of Social Gatherings with Friends and Family: More than three times a week    Attends Religious Services: More than 4 times per year    Active Member of [employer] or Organizations: Yes    Attends Music therapist: More than 4 times per year    Marital Status: Married    Tobacco Counseling Counseling given: Not Answered Tobacco comments: quit 1975   Clinical Intake:  Pre-visit preparation completed: Yes  Pain : 0-10 Pain Score: 4  Pain Type: Chronic pain Pain Location: Back Pain Orientation: Lower Pain Descriptors / Indicators: Aching, Discomfort Pain Onset: More than a month ago Pain Frequency: Intermittent     BMI - recorded: 29.14 Nutritional Status: BMI 25 -29 Overweight Nutritional Risks: None Diabetes: No  How often do you need to have someone help you when you read instructions, pamphlets, or other written materials from your doctor or pharmacy?: 1 - Never What is the last grade level you completed in school?: HSG; Continuing Education Courses  Diabetic? no  Interpreter Needed?: No  Information entered by :: Lisette Abu, LPN.   Activities of Daily Living    12/22/2021    1:43 PM  In your present state of health, do you have any difficulty performing the following activities:  Hearing? 0  Vision? 0  Difficulty concentrating or  making decisions? 0  Walking or climbing stairs? 0  Dressing or bathing? 0  Doing errands, shopping? 0  Preparing Food and eating ? N  Using the Toilet? N  In the past six months, have you accidently leaked urine? N  Do you have problems with loss of bowel control? N  Managing your Medications? N  Managing your Finances? N  Housekeeping or managing your Housekeeping? N    Patient Care Team: Plotnikov, Evie Lacks, MD as PCP - General (Internal Medicine) Burnell Blanks, MD as PCP - Cardiology (Cardiology) Franchot Gallo, MD as Consulting Physician (Urology) Burnell Blanks, MD as Consulting Physician (Cardiology) [place] as Consulting Physician (Optometry)  Indicate any recent Medical Services you may have received from other than [facility] providers in  the past year (date may be approximate).     Assessment:   This is a routine wellness examination for Patrick Morgan.  Hearing/Vision screen Hearing Screening - Comments:: Patient denied any hearing difficulty.   No hearing aids.  Vision Screening - Comments:: Patient wears readers for small print. [facility] exam done by: [facility]  Dietary issues and exercise activities discussed: Current Exercise Habits: Home exercise routine, Type of exercise: walking;Other - see comments (yard work, working in International aid/development worker), Time (Minutes): 30, Frequency (Times/Week): 5, Weekly Exercise (Minutes/Week): 150, Intensity: Mild, Exercise limited by: orthopedic condition(s)   Goals Addressed             This Visit's Progress    To maintain my current health status by continuing to eat healthy, stay physically active and socially active.        Depression Screen    12/22/2021    1:39 PM 09/18/2020    9:44 AM 10/07/2017    9:11 AM 10/06/2016    9:47 AM 07/11/2015   10:51 AM  PHQ 2/9 Scores  PHQ - 2 Score 0 0 0 0 0  PHQ- 9 Score  0       Fall Risk    12/22/2021    1:43 PM 09/18/2020    9:44 AM  05/23/2019   12:03 PM 10/07/2017    9:11 AM 10/06/2016    9:47 AM  [place] in the past year? 0 0 0 No No  Comment   Emmi Telephone Survey: data to providers prior to load    Number falls in past yr: 0 0     Injury with Fall? 0 0     Risk for fall due to : No Fall Risks No Fall Risks     Follow up Falls evaluation completed        FALL RISK PREVENTION PERTAINING TO THE HOME:  Any stairs in or around the home? Yes  If so, are there any without handrails? No  Home free of loose throw rugs in walkways, pet beds, electrical cords, etc? No  Adequate lighting in your home to reduce risk of falls? No   ASSISTIVE DEVICES UTILIZED TO PREVENT FALLS:  Life alert? No  Use of a cane, walker or w/c? No  Grab bars in the bathroom? Yes  Shower chair or bench in shower? No  Elevated toilet seat or a handicapped toilet? No   TIMED UP AND GO:  Was the test performed? No .  Length of time to ambulate 10 feet: n/a sec.   Appearance of gait: Patient not evaluated for gait during this visit.  Cognitive Function:        12/22/2021    1:44 PM  6CIT Screen  What Year? 0 points  What month? 0 points  What time? 0 points  Count back from 20 0 points  Months in reverse 0 points  Repeat phrase 0 points  Total Score 0 points    Immunizations Immunization History  Administered Date(s) Administered   Fluad Quad(high Dose 65+) 03/21/2019   Influenza Whole 02/28/2012, 04/05/2013   Influenza, High Dose Seasonal PF 03/25/2015, 03/23/2017, 03/10/2018   Influenza,inj,Quad PF,6+ Mos 03/13/2016   Moderna SARS-COV2 Booster Vaccination 05/07/2020   Moderna Sars-Covid-2 Vaccination 07/07/2019, 08/04/2019   Pneumococcal Conjugate-13 06/14/2013   Pneumococcal Polysaccharide-23 06/29/2009   Td 06/09/2012   Zoster, Live 06/29/2009    TDAP status: Up to date  Flu Vaccine status: Up to date  Pneumococcal  vaccine status: Due, Education has been provided regarding the importance of this  vaccine. Advised may receive this vaccine at local pharmacy or Health Dept. Aware to provide a copy of the vaccination record if obtained from local pharmacy or Health Dept. Verbalized acceptance and understanding.  Covid-19 vaccine status: Completed vaccines  Qualifies for Shingles Vaccine? Yes   Zostavax completed Yes   Shingrix Completed?: No.    Education has been provided regarding the importance of this vaccine. Patient has been advised to call insurance company to determine out of pocket expense if they have not yet received this vaccine. Advised may also receive vaccine at local pharmacy or Health Dept. Verbalized acceptance and understanding.  Screening Tests Health Maintenance  Topic Date Due   Zoster Vaccines- Shingrix (1 of 2) Never done   Pneumonia Vaccine 54+ Years old (3 - PPSV23 if available, else PCV20) 06/29/2014   COVID-19 Vaccine (3 - Moderna series) 07/02/2020   INFLUENZA VACCINE  01/27/2022   TETANUS/TDAP  06/09/2022   COLONOSCOPY (Pts 45-59yr Insurance coverage will need to be confirmed)  02/18/2026   Hepatitis C Screening  Completed   HPV VACCINES  Aged Out    Health Maintenance  Health Maintenance Due  Topic Date Due   Zoster Vaccines- Shingrix (1 of 2) Never done   Pneumonia Vaccine 76 Years old (3 - PPSV23 if available, else PCV20) 06/29/2014   COVID-19 Vaccine (3 - Moderna series) 07/02/2020    Colorectal cancer screening: Type of screening: Colonoscopy. Completed 02/19/2016. Repeat every 10 years  Lung Cancer Screening: (Low Dose CT Chest recommended if Age 76-80years, 30 pack-year currently smoking OR have quit w/in 15years.) does not qualify.   Lung Cancer Screening Referral: no  Additional Screening:  Hepatitis C Screening: does qualify; Completed 10/09/2015  Vision Screening: Recommended annual ophthalmology exams for early detection of glaucoma and other disorders of the eye. Is the patient up to date with their annual eye exam?  Yes  Who  is the provider or what is the name of the office in which the patient attends annual eye exams? [facility] is not established with a provider, would they like to be referred to a provider to establish care? No .   Dental Screening: Recommended annual dental exams for proper oral hygiene  Community Resource Referral / Chronic Care Management: CRR required this visit?  No   CCM required this visit?  No      Plan:     I have personally reviewed and noted the following in the patient's chart:   Medical and social history Use of alcohol, tobacco or illicit drugs  Current medications and supplements including opioid prescriptions. Patient is not currently taking opioid prescriptions. Functional ability and status Nutritional status Physical activity Advanced directives List of other physicians Hospitalizations, surgeries, and ER visits in previous 12 months Vitals Screenings to include cognitive, depression, and falls Referrals and appointments  In addition, I have reviewed and discussed with patient certain preventive protocols, quality metrics, and best practice recommendations. A written personalized care plan for preventive services as well as general preventive health recommendations were provided to patient.     SSheral Flow LPN   [id number]  Nurse Notes:  Patient is cogitatively intact. There were no vitals filed for this visit. There is no height or weight on file to calculate BMI. Patient stated that he has no issues with gait or balance; does not use any assistive devices.  Medical screening  examination/treatment/procedure(s) were performed by non-physician practitioner and as supervising physician I was immediately available for consultation/collaboration.  I agree with above. Lew Dawes, MD

## 2022-01-19 ENCOUNTER — Ambulatory Visit (INDEPENDENT_AMBULATORY_CARE_PROVIDER_SITE_OTHER): Payer: Medicare Other | Admitting: Cardiovascular Disease

## 2022-01-19 ENCOUNTER — Encounter: Payer: Self-pay | Admitting: Cardiovascular Disease

## 2022-01-19 VITALS — BP 170/70 | HR 91 | Ht 70.5 in | Wt 204.8 lb

## 2022-01-19 DIAGNOSIS — I10 Essential (primary) hypertension: Secondary | ICD-10-CM

## 2022-01-19 DIAGNOSIS — I493 Ventricular premature depolarization: Secondary | ICD-10-CM

## 2022-01-19 MED ORDER — AMLODIPINE BESYLATE 10 MG PO TABS: 10.0000 mg | ORAL_TABLET | Freq: Every day | ORAL | 3 refills | Status: DC

## 2022-01-19 NOTE — Progress Notes (Signed)
Evaluation Performed:  Follow-up visit  Date:  01/19/2022   ID:  Patrick Morgan, DOB 26-Jun-1946, MRN [id number]  PCP:  Cassandria Anger, MD  Cardiologist:  Lauree Chandler, MD  Electrophysiologist:  None   Chief Complaint:  Follow up: PVCs  History of Present Illness:    Patrick Morgan is a 76 y.o. male with history of HTN, palpitations and PVCs who is being seen today for cardiac follow up. He had been seen previously for palpitations. EKG with sinus tachycardia and TSH normal in 2016. Echo December 2016 with normal LV systolic function, LVEF 87-86%, no valve disease. 24 hour cardiac monitor in 2016 with sinus rhythm, rare PVCs, frequent PACs. He was started on a beta blocker. He has done well since then.   He is here today for follow up. The patient denies any chest pain, dyspnea, palpitations, lower extremity edema, orthopnea, PND, dizziness, near syncope or syncope.    Past Medical History:  Diagnosis Date   Hypertension    PAC (premature atrial contraction)    PVC's (premature ventricular contractions)    Vertigo    Past Surgical History:  Procedure Laterality Date   NO PAST SURGERIES       Current Meds  Medication Sig   amLODipine (NORVASC) 10 MG tablet Take 1 tablet (10 mg total) by mouth daily.   aspirin 81 MG tablet Take 81 mg by mouth daily.   losartan (COZAAR) 100 MG tablet TAKE 1 TABLET(100 MG) BY MOUTH DAILY   metoprolol tartrate (LOPRESSOR) 25 MG tablet TAKE 1 TABLET(25 MG) BY MOUTH TWICE DAILY   Multiple Vitamin (MULTIVITAMIN) tablet Take 1 tablet by mouth daily.   traMADol (ULTRAM) 50 MG tablet TAKE 1 TO 2 TABLETS BY MOUTH DAILY FOR PAIN   VITAMIN D PO Take 2,000 Units by mouth daily.   [DISCONTINUED] amLODipine (NORVASC) 5 MG tablet TAKE 1 TABLET(5 MG) BY MOUTH DAILY     Allergies:   Codeine and Telmisartan   Social History   Tobacco Use   Smoking status: Former   Smokeless tobacco: Never   Tobacco comments:    quit 1975  Vaping Use    Vaping Use: Never used  Substance Use Topics   Alcohol use: No    Alcohol/week: 0.0 standard drinks of alcohol    Comment: quit 2002   Drug use: No     Family Hx: The patient's family history includes Alzheimer's disease in his mother; Cancer (age of onset: 3) in his father; Diabetes in his father.  ROS:   Please see the history of present illness.    All other systems reviewed and are negative.   Prior CV studies:   The following studies were reviewed today:  Echo 06/27/15: - Left ventricle: The cavity size was normal. Systolic function was   normal. The estimated ejection fraction was in the range of 60%   to 65%. Wall motion was normal; there were no regional wall   motion abnormalities. - Atrial septum: No defect or patent foramen ovale was identified.  Labs/Other Tests and Data Reviewed:    EKG:  Sinus-Personally reviewed   Recent Labs: 09/17/2021: ALT 18; BUN 13; Creatinine, Ser 0.93; Potassium 4.1; Sodium 138   Recent Lipid Panel Lab Results  Component Value Date/Time   CHOL 203 (H) 09/18/2020 10:33 AM   TRIG 75.0 09/18/2020 10:33 AM   HDL 52.90 09/18/2020 10:33 AM   CHOLHDL 4 09/18/2020 10:33 AM   LDLCALC 135 (H) 09/18/2020  10:33 AM    Wt Readings from Last 3 Encounters:  01/19/22 204 lb 12.8 oz (92.9 kg)  09/17/21 206 lb (93.4 kg)  03/18/21 202 lb (91.6 kg)     Objective:    Vital Signs:  BP (!) 170/70   Pulse 91   Ht 5' 10.5" (1.791 m)   Wt 204 lb 12.8 oz (92.9 kg)   SpO2 98%   BMI 28.97 kg/m     General: Well developed, well nourished, NAD  HEENT: OP clear, mucus membranes moist  SKIN: warm, dry. No rashes. Neuro: No focal deficits  Musculoskeletal: Muscle strength 5/5 all ext  Psychiatric: Mood and affect normal  Neck: No JVD, no carotid bruits, no thyromegaly, no lymphadenopathy.  Lungs:Clear bilaterally, no wheezes, rhonci, crackles Cardiovascular: Regular rate and rhythm. No murmurs, gallops or rubs. Abdomen:Soft. Bowel sounds  present. Non-tender.  Extremities: No lower extremity edema. Pulses are 2 + in the bilateral DP/PT.   ASSESSMENT & PLAN:    1. Palpitations/PACs/PVCs: He is known to have PACs and PVCs by cardiac monitor in January 2017. Echo December 2016 with normal LV function, no valve disease. He is doing well on the beta blocker. He has no palpitations. Will continue his beta blocker.       2. HTN: BP is elevated today. His BP is 532-992 systolic at home. Will increase Norvasc to 10 mg daily. He will follow his BP at home.   Medication Adjustments/Labs and Tests Ordered: Current medicines are reviewed at length with the patient today.  Concerns regarding medicines are outlined above.   Tests Ordered: Orders Placed This Encounter  Procedures   EKG 12-Lead    Medication Changes: Meds ordered this encounter  Medications   amLODipine (NORVASC) 10 MG tablet    Sig: Take 1 tablet (10 mg total) by mouth daily.    Dispense:  90 tablet    Refill:  3    Dose decrease    Disposition:  Follow up in 1 year(s)  Signed, Lauree Chandler, MD  01/19/2022 3:49 PM    [place]

## 2022-01-19 NOTE — Patient Instructions (Signed)
Medication Instructions:  Your physician has recommended you make the following change in your medication:  1.) increase amlodipine (Norvasc) to 10 mg - one tablet daily  *If you need a refill on your cardiac medications before your next appointment, please call your pharmacy*   Lab Work: none If you have labs (blood work) drawn today and your tests are completely normal, you will receive your results only by: [facility] (if you have MyChart) OR A paper copy in the mail If you have any lab test that is abnormal or we need to change your treatment, we will call you to review the results.   Testing/Procedures: none   Follow-Up: At [facility], you and your health needs are our priority.  As part of our continuing mission to provide you with exceptional heart care, we have created designated Provider Care Teams.  These Care Teams include your primary Cardiologist (physician) and Advanced Practice Providers (APPs -  Physician Assistants and Nurse Practitioners) who all work together to provide you with the care you need, when you need it.    Your next appointment:   12 month(s)  The format for your next appointment:   In Person  Provider:   Lauree Chandler, MD     Important Information About Sugar

## 2022-03-20 DIAGNOSIS — Z23 Encounter for immunization: Secondary | ICD-10-CM | POA: Diagnosis not present

## 2022-03-24 DIAGNOSIS — Z23 Encounter for immunization: Secondary | ICD-10-CM | POA: Diagnosis not present

## 2022-03-25 ENCOUNTER — Ambulatory Visit (INDEPENDENT_AMBULATORY_CARE_PROVIDER_SITE_OTHER): Payer: Medicare Other | Admitting: Internal Medicine

## 2022-03-25 ENCOUNTER — Encounter: Payer: Self-pay | Admitting: Internal Medicine

## 2022-03-25 VITALS — BP 150/68 | HR 71 | Temp 98.7°F | Ht 70.5 in | Wt 206.2 lb

## 2022-03-25 DIAGNOSIS — E785 Hyperlipidemia, unspecified: Secondary | ICD-10-CM | POA: Diagnosis not present

## 2022-03-25 DIAGNOSIS — R972 Elevated prostate specific antigen [PSA]: Secondary | ICD-10-CM | POA: Diagnosis not present

## 2022-03-25 DIAGNOSIS — I10 Essential (primary) hypertension: Secondary | ICD-10-CM | POA: Diagnosis not present

## 2022-03-25 DIAGNOSIS — M5440 Lumbago with sciatica, unspecified side: Secondary | ICD-10-CM | POA: Diagnosis not present

## 2022-03-25 LAB — URINALYSIS
Bilirubin Urine: NEGATIVE
Hgb urine dipstick: NEGATIVE
Ketones, ur: NEGATIVE
Leukocytes,Ua: NEGATIVE
Nitrite: NEGATIVE
Specific Gravity, Urine: 1.02 (ref 1.000–1.030)
Total Protein, Urine: NEGATIVE
Urine Glucose: NEGATIVE
Urobilinogen, UA: 2 — AB (ref 0.0–1.0)
pH: 6 (ref 5.0–8.0)

## 2022-03-25 LAB — COMPREHENSIVE METABOLIC PANEL
ALT: 22 U/L (ref 0–53)
AST: 24 U/L (ref 0–37)
Albumin: 4.5 g/dL (ref 3.5–5.2)
Alkaline Phosphatase: 64 U/L (ref 39–117)
BUN: 16 mg/dL (ref 6–23)
CO2: 23 mEq/L (ref 19–32)
Calcium: 9.5 mg/dL (ref 8.4–10.5)
Chloride: 104 mEq/L (ref 96–112)
Creatinine, Ser: 0.9 mg/dL (ref 0.40–1.50)
GFR: 83.23 mL/min (ref >60.00)
Glucose, Bld: 107 mg/dL — ABNORMAL HIGH (ref 70–99)
Potassium: 4.1 mEq/L (ref 3.5–5.1)
Sodium: 137 mEq/L (ref 135–145)
Total Bilirubin: 0.9 mg/dL (ref 0.2–1.2)
Total Protein: 7.2 g/dL (ref 6.0–8.3)

## 2022-03-25 LAB — CBC WITH DIFFERENTIAL/PLATELET
Basophils Absolute: 0 10*3/uL (ref 0.0–0.1)
Basophils Relative: 0.7 % (ref 0.0–3.0)
Eosinophils Absolute: 0.2 10*3/uL (ref 0.0–0.7)
Eosinophils Relative: 2.7 % (ref 0.0–5.0)
HCT: 42.2 % (ref 39.0–52.0)
Hemoglobin: 14.7 g/dL (ref 13.0–17.0)
Lymphocytes Relative: 24.4 % (ref 12.0–46.0)
Lymphs Abs: 1.6 10*3/uL (ref 0.7–4.0)
MCHC: 34.9 g/dL (ref 30.0–36.0)
MCV: 102.1 fl — ABNORMAL HIGH (ref 78.0–100.0)
Monocytes Absolute: 0.7 10*3/uL (ref 0.1–1.0)
Monocytes Relative: 10.3 % (ref 3.0–12.0)
Neutro Abs: 4.1 10*3/uL (ref 1.4–7.7)
Neutrophils Relative %: 61.9 % (ref 43.0–77.0)
Platelets: 188 10*3/uL (ref 150.0–400.0)
RBC: 4.13 Mil/uL — ABNORMAL LOW (ref 4.22–5.81)
RDW: 13 % (ref 11.5–15.5)
WBC: 6.6 10*3/uL (ref 4.0–10.5)

## 2022-03-25 LAB — LIPID PANEL
Cholesterol: 163 mg/dL (ref 0–200)
HDL: 44.5 mg/dL (ref >39.00)
LDL Cholesterol: 102 mg/dL — ABNORMAL HIGH (ref 0–99)
NonHDL: 118.38
Total CHOL/HDL Ratio: 4
Triglycerides: 84 mg/dL (ref 0.0–149.0)
VLDL: 16.8 mg/dL (ref 0.0–40.0)

## 2022-03-25 LAB — TSH: TSH: 1.5 u[IU]/mL (ref 0.35–5.50)

## 2022-03-25 LAB — PSA: PSA: 3.05 ng/mL (ref 0.10–4.00)

## 2022-03-25 MED ORDER — TRAMADOL HCL 50 MG PO TABS: ORAL_TABLET | ORAL | 3 refills | Status: DC

## 2022-03-25 NOTE — Assessment & Plan Note (Signed)
BP ok at home with SBP<130-140 Cont on Losartan, Metoprolol, Amlodipine NAS diet

## 2022-03-25 NOTE — Assessment & Plan Note (Signed)
Tramadol prn Using a back brace OTC Advil, Tylenol prn Potential benefits of a long term opioids use as well as potential risks (i.e. addiction risk, apnea etc) and complications (i.e. Somnolence, constipation and others) were explained to the patient and were aknowledged.  Using [employer]

## 2022-03-25 NOTE — Progress Notes (Signed)
Subjective:  Patient ID: Patrick Morgan, male    DOB: 1945/12/17  Age: 76 y.o. MRN: [id number]  CC: Follow-up (6 month f/u)   HPI Patrick Morgan presents for LBP, HTN, OA Pt had a COVID shot yesterday  Outpatient Medications Prior to Visit  Medication Sig Dispense Refill   amLODipine (NORVASC) 10 MG tablet Take 1 tablet (10 mg total) by mouth daily. 90 tablet 3   aspirin 81 MG tablet Take 81 mg by mouth daily.     losartan (COZAAR) 100 MG tablet TAKE 1 TABLET(100 MG) BY MOUTH DAILY 90 tablet 2   metoprolol tartrate (LOPRESSOR) 25 MG tablet TAKE 1 TABLET(25 MG) BY MOUTH TWICE DAILY 180 tablet 2   Multiple Vitamin (MULTIVITAMIN) tablet Take 1 tablet by mouth daily.     VITAMIN D PO Take 2,000 Units by mouth daily.     traMADol (ULTRAM) 50 MG tablet TAKE 1 TO 2 TABLETS BY MOUTH DAILY FOR PAIN 120 tablet 3   No facility-administered medications prior to visit.    ROS: Review of Systems  Constitutional:  Negative for appetite change, fatigue and unexpected weight change.  HENT:  Negative for congestion, nosebleeds, sneezing, sore throat and trouble swallowing.   Eyes:  Negative for itching and visual disturbance.  Respiratory:  Negative for cough.   Cardiovascular:  Negative for chest pain, palpitations and leg swelling.  Gastrointestinal:  Negative for abdominal distention, blood in stool, diarrhea and nausea.  Genitourinary:  Negative for frequency and hematuria.  Musculoskeletal:  Positive for arthralgias and back pain. Negative for gait problem, joint swelling and neck pain.  Skin:  Negative for rash.  Neurological:  Negative for dizziness, tremors, speech difficulty and weakness.  Psychiatric/Behavioral:  Negative for agitation, dysphoric mood and sleep disturbance. The patient is not nervous/anxious.     Objective:  BP (!) 150/68 (BP Location: Left Arm)   Pulse 71   Temp 98.7 F (37.1 C) (Oral)   Ht 5' 10.5" (1.791 m)   Wt 206 lb 3.2 oz (93.5 kg)   SpO2 97%   BMI  29.17 kg/m   BP Readings from Last 3 Encounters:  03/25/22 (!) 150/68  01/19/22 (!) 170/70  09/17/21 (!) 158/80    Wt Readings from Last 3 Encounters:  03/25/22 206 lb 3.2 oz (93.5 kg)  01/19/22 204 lb 12.8 oz (92.9 kg)  09/17/21 206 lb (93.4 kg)    Physical Exam Constitutional:      General: He is not in acute distress.    Appearance: Normal appearance. He is well-developed.     Comments: NAD  Eyes:     Conjunctiva/sclera: Conjunctivae normal.     Pupils: Pupils are equal, round, and reactive to light.  Neck:     Thyroid: No thyromegaly.     Vascular: No JVD.  Cardiovascular:     Rate and Rhythm: Normal rate and regular rhythm.     Heart sounds: Normal heart sounds. No murmur heard.    No friction rub. No gallop.  Pulmonary:     Effort: Pulmonary effort is normal. No respiratory distress.     Breath sounds: Normal breath sounds. No wheezing or rales.  Chest:     Chest wall: No tenderness.  Abdominal:     General: Bowel sounds are normal. There is no distension.     Palpations: Abdomen is soft. There is no mass.     Tenderness: There is no abdominal tenderness. There is no guarding or rebound.  Musculoskeletal:  General: No tenderness. Normal range of motion.     Cervical back: Normal range of motion.  Lymphadenopathy:     Cervical: No cervical adenopathy.  Skin:    General: Skin is warm and dry.     Findings: No rash.  Neurological:     Mental Status: He is alert and oriented to person, place, and time.     Cranial Nerves: No cranial nerve deficit.     Motor: No abnormal muscle tone.     Coordination: Coordination normal.     Gait: Gait normal.     Deep Tendon Reflexes: Reflexes are normal and symmetric.  Psychiatric:        Behavior: Behavior normal.        Thought Content: Thought content normal.        Judgment: Judgment normal.     Lab Results  Component Value Date   WBC 6.7 09/18/2020   HGB 15.6 09/18/2020   HCT 44.9 09/18/2020   PLT 190.0  09/18/2020   GLUCOSE 105 (H) 09/17/2021   CHOL 203 (H) 09/18/2020   TRIG 75.0 09/18/2020   HDL 52.90 09/18/2020   LDLCALC 135 (H) 09/18/2020   ALT 18 09/17/2021   AST 21 09/17/2021   NA 138 09/17/2021   K 4.1 09/17/2021   CL 102 09/17/2021   CREATININE 0.93 09/17/2021   BUN 13 09/17/2021   CO2 26 09/17/2021   TSH 1.23 09/18/2020   PSA 2.67 09/17/2021   HGBA1C 4.9 09/17/2021    DG Chest 2 View  Addendum Date: 06/08/2015   ADDENDUM REPORT: 06/08/2015 10:30 ADDENDUM: Impression should read:  No edema or consolidation. Electronically Signed   By: Lowella Grip III M.D.   On: 06/08/2015 10:30  Result Date: 06/08/2015 CLINICAL DATA:  Cardiac palpitations for 2 weeks.  Hypertension. EXAM: CHEST  2 VIEW COMPARISON:  None. FINDINGS: Lungs are clear. Heart size and pulmonary vascularity are normal. No adenopathy. There is degenerative change thoracic spine. IMPRESSION: Edema or consolidation. Electronically Signed: By: Lowella Grip III M.D. On: 06/08/2015 10:26    Assessment & Plan:   Problem List Items Addressed This Visit     Elevated PSA    Monitor PSA      Relevant Orders   PSA   Hypertension    BP ok at home with SBP<130-140 Cont on Losartan, Metoprolol, Amlodipine NAS diet      Relevant Orders   TSH   Urinalysis   CBC with Differential/Platelet   Lipid panel   PSA   Comprehensive metabolic panel   Low back pain    Tramadol prn Using a back brace OTC Advil, Tylenol prn Potential benefits of a long term opioids use as well as potential risks (i.e. addiction risk, apnea etc) and complications (i.e. Somnolence, constipation and others) were explained to the patient and were aknowledged.  Using [employer]      Relevant Medications   traMADol (ULTRAM) 50 MG tablet   Other Visit Diagnoses     Dyslipidemia    -  Primary   Relevant Orders   TSH   Lipid panel         Meds ordered this encounter  Medications   traMADol (ULTRAM) 50 MG tablet    Sig:  TAKE 1 TO 2 TABLETS BY MOUTH DAILY FOR PAIN    Dispense:  120 tablet    Refill:  3      Follow-up: Return in about 6 months (around 09/23/2022) for Wellness Exam.  Cristie Hem  Mililani Murthy, MD

## 2022-03-25 NOTE — Assessment & Plan Note (Signed)
Monitor PSA 

## 2022-03-26 ENCOUNTER — Encounter: Payer: Self-pay | Admitting: Internal Medicine

## 2022-03-26 DIAGNOSIS — R002 Palpitations: Secondary | ICD-10-CM | POA: Insufficient documentation

## 2022-09-30 ENCOUNTER — Ambulatory Visit (INDEPENDENT_AMBULATORY_CARE_PROVIDER_SITE_OTHER): Payer: Medicare Other | Admitting: Internal Medicine

## 2022-09-30 ENCOUNTER — Encounter: Payer: Self-pay | Admitting: Internal Medicine

## 2022-09-30 VITALS — BP 150/60 | HR 76 | Temp 98.2°F | Ht 70.5 in | Wt 204.0 lb

## 2022-09-30 DIAGNOSIS — I10 Essential (primary) hypertension: Secondary | ICD-10-CM

## 2022-09-30 DIAGNOSIS — M5440 Lumbago with sciatica, unspecified side: Secondary | ICD-10-CM

## 2022-09-30 DIAGNOSIS — R739 Hyperglycemia, unspecified: Secondary | ICD-10-CM

## 2022-09-30 DIAGNOSIS — Z860100 Personal history of colon polyps, unspecified: Secondary | ICD-10-CM | POA: Insufficient documentation

## 2022-09-30 DIAGNOSIS — R140 Abdominal distension (gaseous): Secondary | ICD-10-CM | POA: Insufficient documentation

## 2022-09-30 DIAGNOSIS — Z0000 Encounter for general adult medical examination without abnormal findings: Secondary | ICD-10-CM

## 2022-09-30 DIAGNOSIS — R972 Elevated prostate specific antigen [PSA]: Secondary | ICD-10-CM | POA: Diagnosis not present

## 2022-09-30 LAB — CBC WITH DIFFERENTIAL/PLATELET
Basophils Absolute: 0 10*3/uL (ref 0.0–0.1)
Basophils Relative: 0.7 % (ref 0.0–3.0)
Eosinophils Absolute: 0.1 10*3/uL (ref 0.0–0.7)
Eosinophils Relative: 1.6 % (ref 0.0–5.0)
HCT: 45.5 % (ref 39.0–52.0)
Hemoglobin: 16 g/dL (ref 13.0–17.0)
Lymphocytes Relative: 20.8 % (ref 12.0–46.0)
Lymphs Abs: 1.5 10*3/uL (ref 0.7–4.0)
MCHC: 35.2 g/dL (ref 30.0–36.0)
MCV: 102.1 fl — ABNORMAL HIGH (ref 78.0–100.0)
Monocytes Absolute: 0.5 10*3/uL (ref 0.1–1.0)
Monocytes Relative: 6.9 % (ref 3.0–12.0)
Neutro Abs: 4.9 10*3/uL (ref 1.4–7.7)
Neutrophils Relative %: 70 % (ref 43.0–77.0)
Platelets: 197 10*3/uL (ref 150.0–400.0)
RBC: 4.46 Mil/uL (ref 4.22–5.81)
RDW: 12.6 % (ref 11.5–15.5)
WBC: 7 10*3/uL (ref 4.0–10.5)

## 2022-09-30 LAB — COMPREHENSIVE METABOLIC PANEL
ALT: 17 U/L (ref 0–53)
AST: 20 U/L (ref 0–37)
Albumin: 4.7 g/dL (ref 3.5–5.2)
Alkaline Phosphatase: 76 U/L (ref 39–117)
BUN: 15 mg/dL (ref 6–23)
CO2: 25 mEq/L (ref 19–32)
Calcium: 9.7 mg/dL (ref 8.4–10.5)
Chloride: 103 mEq/L (ref 96–112)
Creatinine, Ser: 0.85 mg/dL (ref 0.40–1.50)
GFR: 84.38 mL/min (ref >60.00)
Glucose, Bld: 100 mg/dL — ABNORMAL HIGH (ref 70–99)
Potassium: 4 mEq/L (ref 3.5–5.1)
Sodium: 139 mEq/L (ref 135–145)
Total Bilirubin: 0.9 mg/dL (ref 0.2–1.2)
Total Protein: 7.3 g/dL (ref 6.0–8.3)

## 2022-09-30 LAB — PSA: PSA: 2.43 ng/mL (ref 0.10–4.00)

## 2022-09-30 LAB — URINALYSIS
Bilirubin Urine: NEGATIVE
Hgb urine dipstick: NEGATIVE
Ketones, ur: NEGATIVE
Leukocytes,Ua: NEGATIVE
Nitrite: NEGATIVE
Specific Gravity, Urine: 1.02 (ref 1.000–1.030)
Total Protein, Urine: NEGATIVE
Urine Glucose: NEGATIVE
Urobilinogen, UA: 1 (ref 0.0–1.0)
pH: 6 (ref 5.0–8.0)

## 2022-09-30 LAB — LIPID PANEL
Cholesterol: 205 mg/dL — ABNORMAL HIGH (ref 0–200)
HDL: 56.8 mg/dL (ref >39.00)
LDL Cholesterol: 132 mg/dL — ABNORMAL HIGH (ref 0–99)
NonHDL: 148.32
Total CHOL/HDL Ratio: 4
Triglycerides: 80 mg/dL (ref 0.0–149.0)
VLDL: 16 mg/dL (ref 0.0–40.0)

## 2022-09-30 LAB — TSH: TSH: 1.27 u[IU]/mL (ref 0.35–5.50)

## 2022-09-30 LAB — HEMOGLOBIN A1C: Hgb A1c MFr Bld: 4.8 % (ref 4.6–6.5)

## 2022-09-30 MED ORDER — OLMESARTAN-AMLODIPINE-HCTZ 40-10-25 MG PO TABS: 1.0000 | ORAL_TABLET | ORAL | 3 refills | Status: DC

## 2022-09-30 NOTE — Patient Instructions (Signed)
Start Tribenzor in place of Losartan, Amlodipine Continue Metoprolol

## 2022-09-30 NOTE — Assessment & Plan Note (Signed)
On a much better diet, will stay off Metformin for now

## 2022-09-30 NOTE — Assessment & Plan Note (Signed)
  Tramadol prn Using a back brace Add OTC Advil, Tylenol prn  Potential benefits of a long term opioids use as well as potential risks (i.e. addiction risk, apnea etc) and complications (i.e. Somnolence, constipation and others) were explained to the patient and were aknowledged.  Using [employer]

## 2022-09-30 NOTE — Progress Notes (Signed)
Subjective:  Patient ID: Patrick Morgan, male    DOB: 1946/06/19  Age: 77 y.o. MRN: [id number]  CC: Follow-up (6 mnth f/u)   HPI Patrick Morgan presents for HTN, LBP, elevated PSA SBP 150-160 lately  Outpatient Medications Prior to Visit  Medication Sig Dispense Refill   aspirin 81 MG tablet Take 81 mg by mouth daily.     metoprolol tartrate (LOPRESSOR) 25 MG tablet TAKE 1 TABLET(25 MG) BY MOUTH TWICE DAILY 180 tablet 2   Multiple Vitamin (MULTIVITAMIN) tablet Take 1 tablet by mouth daily.     traMADol (ULTRAM) 50 MG tablet TAKE 1 TO 2 TABLETS BY MOUTH DAILY FOR PAIN 120 tablet 3   VITAMIN D PO Take 2,000 Units by mouth daily.     amLODipine (NORVASC) 10 MG tablet Take 1 tablet (10 mg total) by mouth daily. 90 tablet 3   losartan (COZAAR) 100 MG tablet TAKE 1 TABLET(100 MG) BY MOUTH DAILY 90 tablet 2   No facility-administered medications prior to visit.    ROS: Review of Systems  Constitutional:  Negative for appetite change, fatigue and unexpected weight change.  HENT:  Negative for congestion, nosebleeds, sneezing, sore throat and trouble swallowing.   Eyes:  Negative for itching and visual disturbance.  Respiratory:  Negative for cough.   Cardiovascular:  Negative for chest pain, palpitations and leg swelling.  Gastrointestinal:  Negative for abdominal distention, blood in stool, diarrhea and nausea.  Genitourinary:  Negative for frequency and hematuria.  Musculoskeletal:  Positive for back pain. Negative for gait problem, joint swelling and neck pain.  Skin:  Negative for rash.  Neurological:  Negative for dizziness, tremors, speech difficulty and weakness.  Psychiatric/Behavioral:  Negative for agitation, dysphoric mood, sleep disturbance and suicidal ideas. The patient is not nervous/anxious.     Objective:  BP (!) 150/60 (BP Location: Left Arm, Patient Position: Sitting, Cuff Size: Normal)   Pulse 76   Temp 98.2 F (36.8 C) (Oral)   Ht 5' 10.5" (1.791 m)   Wt 204  lb (92.5 kg)   SpO2 98%   BMI 28.86 kg/m   BP Readings from Last 3 Encounters:  09/30/22 (!) 150/60  03/25/22 (!) 150/68  01/19/22 (!) 170/70    Wt Readings from Last 3 Encounters:  09/30/22 204 lb (92.5 kg)  03/25/22 206 lb 3.2 oz (93.5 kg)  01/19/22 204 lb 12.8 oz (92.9 kg)    Physical Exam Constitutional:      General: He is not in acute distress.    Appearance: He is well-developed.     Comments: NAD  Eyes:     Conjunctiva/sclera: Conjunctivae normal.     Pupils: Pupils are equal, round, and reactive to light.  Neck:     Thyroid: No thyromegaly.     Vascular: No JVD.  Cardiovascular:     Rate and Rhythm: Normal rate and regular rhythm.     Heart sounds: Normal heart sounds. No murmur heard.    No friction rub. No gallop.  Pulmonary:     Effort: Pulmonary effort is normal. No respiratory distress.     Breath sounds: Normal breath sounds. No wheezing or rales.  Chest:     Chest wall: No tenderness.  Abdominal:     General: Bowel sounds are normal. There is no distension.     Palpations: Abdomen is soft. There is no mass.     Tenderness: There is no abdominal tenderness. There is no guarding or rebound.  Musculoskeletal:  General: No tenderness. Normal range of motion.     Cervical back: Normal range of motion.  Lymphadenopathy:     Cervical: No cervical adenopathy.  Skin:    General: Skin is warm and dry.     Findings: No rash.  Neurological:     Mental Status: He is alert and oriented to person, place, and time.     Cranial Nerves: No cranial nerve deficit.     Motor: No abnormal muscle tone.     Coordination: Coordination normal.     Gait: Gait normal.     Deep Tendon Reflexes: Reflexes are normal and symmetric.  Psychiatric:        Behavior: Behavior normal.        Thought Content: Thought content normal.        Judgment: Judgment normal.   LS w/pain  Lab Results  Component Value Date   WBC 6.6 03/25/2022   HGB 14.7 03/25/2022   HCT 42.2  03/25/2022   PLT 188.0 03/25/2022   GLUCOSE 107 (H) 03/25/2022   CHOL 163 03/25/2022   TRIG 84.0 03/25/2022   HDL 44.50 03/25/2022   LDLCALC 102 (H) 03/25/2022   ALT 22 03/25/2022   AST 24 03/25/2022   NA 137 03/25/2022   K 4.1 03/25/2022   CL 104 03/25/2022   CREATININE 0.90 03/25/2022   BUN 16 03/25/2022   CO2 23 03/25/2022   TSH 1.50 03/25/2022   PSA 3.05 03/25/2022   HGBA1C 4.9 09/17/2021    DG Chest 2 View  Addendum Date: 06/08/2015   ADDENDUM REPORT: 06/08/2015 10:30 ADDENDUM: Impression should read:  No edema or consolidation. Electronically Signed   By: Lowella Grip III M.D.   On: 06/08/2015 10:30  Result Date: 06/08/2015 CLINICAL DATA:  Cardiac palpitations for 2 weeks.  Hypertension. EXAM: CHEST  2 VIEW COMPARISON:  None. FINDINGS: Lungs are clear. Heart size and pulmonary vascularity are normal. No adenopathy. There is degenerative change thoracic spine. IMPRESSION: Edema or consolidation. Electronically Signed: By: Lowella Grip III M.D. On: 06/08/2015 10:26    Assessment & Plan:   Problem List Items Addressed This Visit       Cardiovascular and Mediastinum   Hypertension - Primary    Worse Start Tribenzor in place of Losartan, Amlodipine Continue Metoprolol      Relevant Medications   Olmesartan-amLODIPine-HCTZ (TRIBENZOR) 40-10-25 MG TABS   Other Relevant Orders   TSH   Urinalysis   CBC with Differential/Platelet   Lipid panel   Comprehensive metabolic panel     Other   Well adult exam   Low back pain     Tramadol prn Using a back brace Add OTC Advil, Tylenol prn  Potential benefits of a long term opioids use as well as potential risks (i.e. addiction risk, apnea etc) and complications (i.e. Somnolence, constipation and others) were explained to the patient and were aknowledged.  Using [employer]      Hyperglycemia    On a much better diet, will stay off Metformin for now      Relevant Orders   Comprehensive metabolic panel    Hemoglobin A1c   Elevated PSA   Relevant Orders   PSA      Meds ordered this encounter  Medications   Olmesartan-amLODIPine-HCTZ (TRIBENZOR) 40-10-25 MG TABS    Sig: Take 1 tablet by mouth 1 day or 1 dose.    Dispense:  90 tablet    Refill:  3      Follow-up:  Return in about 6 months (around 04/01/2023) for a follow-up visit.  Walker Kehr, MD

## 2022-09-30 NOTE — Assessment & Plan Note (Addendum)
Worse Start Tribenzor in place of Losartan, Amlodipine Continue Metoprolol

## 2022-11-14 ENCOUNTER — Other Ambulatory Visit: Payer: Self-pay | Admitting: Internal Medicine

## 2022-11-17 MED ORDER — TRAMADOL HCL 50 MG PO TABS: ORAL_TABLET | ORAL | 3 refills | Status: DC

## 2022-12-30 ENCOUNTER — Ambulatory Visit (INDEPENDENT_AMBULATORY_CARE_PROVIDER_SITE_OTHER): Payer: Medicare Other

## 2022-12-30 VITALS — Ht 70.5 in

## 2022-12-30 DIAGNOSIS — Z0000 Encounter for general adult medical examination without abnormal findings: Secondary | ICD-10-CM

## 2022-12-30 NOTE — Patient Instructions (Signed)
Health Maintenance, Male Adopting a healthy lifestyle and getting preventive care are important in promoting health and wellness. Ask your health care provider about: The right schedule for you to have regular tests and exams. Things you can do on your own to prevent diseases and keep yourself healthy. What should I know about diet, weight, and exercise? Eat a healthy diet  Eat a diet that includes plenty of vegetables, fruits, low-fat dairy products, and lean protein. Do not eat a lot of foods that are high in solid fats, added sugars, or sodium. Maintain a healthy weight Body mass index (BMI) is a measurement that can be used to identify possible weight problems. It estimates body fat based on height and weight. Your health care provider can help determine your BMI and help you achieve or maintain a healthy weight. Get regular exercise Get regular exercise. This is one of the most important things you can do for your health. Most adults should: Exercise for at least 150 minutes each week. The exercise should increase your heart rate and make you sweat (moderate-intensity exercise). Do strengthening exercises at least twice a week. This is in addition to the moderate-intensity exercise. Spend less time sitting. Even light physical activity can be beneficial. Watch cholesterol and blood lipids Have your blood tested for lipids and cholesterol at 77 years of age, then have this test every 5 years. You may need to have your cholesterol levels checked more often if: Your lipid or cholesterol levels are high. You are older than 77 years of age. You are at high risk for heart disease. What should I know about cancer screening? Many types of cancers can be detected early and may often be prevented. Depending on your health history and family history, you may need to have cancer screening at various ages. This may include screening for: Colorectal cancer. Prostate cancer. Skin cancer. Lung  cancer. What should I know about heart disease, diabetes, and high blood pressure? Blood pressure and heart disease High blood pressure causes heart disease and increases the risk of stroke. This is more likely to develop in people who have high blood pressure readings or are overweight. Talk with your health care provider about your target blood pressure readings. Have your blood pressure checked: Every 3-5 years if you are 18-39 years of age. Every year if you are 40 years old or older. If you are between the ages of 65 and 75 and are a current or former smoker, ask your health care provider if you should have a one-time screening for abdominal aortic aneurysm (AAA). Diabetes Have regular diabetes screenings. This checks your fasting blood sugar level. Have the screening done: Once every three years after age 45 if you are at a normal weight and have a low risk for diabetes. More often and at a younger age if you are overweight or have a high risk for diabetes. What should I know about preventing infection? Hepatitis B If you have a higher risk for hepatitis B, you should be screened for this virus. Talk with your health care provider to find out if you are at risk for hepatitis B infection. Hepatitis C Blood testing is recommended for: Everyone born from 1945 through 1965. Anyone with known risk factors for hepatitis C. Sexually transmitted infections (STIs) You should be screened each year for STIs, including gonorrhea and chlamydia, if: You are sexually active and are younger than 77 years of age. You are older than 77 years of age and your   health care provider tells you that you are at risk for this type of infection. Your sexual activity has changed since you were last screened, and you are at increased risk for chlamydia or gonorrhea. Ask your health care provider if you are at risk. Ask your health care provider about whether you are at high risk for HIV. Your health care provider  may recommend a prescription medicine to help prevent HIV infection. If you choose to take medicine to prevent HIV, you should first get tested for HIV. You should then be tested every 3 months for as long as you are taking the medicine. Follow these instructions at home: Alcohol use Do not drink alcohol if your health care provider tells you not to drink. If you drink alcohol: Limit how much you have to 0-2 drinks a day. Know how much alcohol is in your drink. In the [place] one drink equals one 12 oz bottle of beer (355 mL), one 5 oz glass of wine (148 mL), or one 1 oz glass of hard liquor (44 mL). Lifestyle Do not use any products that contain nicotine or tobacco. These products include cigarettes, chewing tobacco, and vaping devices, such as e-cigarettes. If you need help quitting, ask your health care provider. Do not use street drugs. Do not share needles. Ask your health care provider for help if you need support or information about quitting drugs. General instructions Schedule regular health, dental, and eye exams. Stay current with your vaccines. Tell your health care provider if: You often feel depressed. You have ever been abused or do not feel safe at home. Summary Adopting a healthy lifestyle and getting preventive care are important in promoting health and wellness. Follow your health care provider's instructions about healthy diet, exercising, and getting tested or screened for diseases. Follow your health care provider's instructions on monitoring your cholesterol and blood pressure. This information is not intended to replace advice given to you by your health care provider. Make sure you discuss any questions you have with your health care provider. Document Revised: 11/04/2020 Document Reviewed: 11/04/2020 Elsevier Patient Education  2024 Elsevier Inc.  

## 2022-12-30 NOTE — Progress Notes (Addendum)
Subjective:   Patrick Morgan is a 77 y.o. male who presents for Medicare Annual/Subsequent preventive examination.  Visit Complete: Virtual  I connected with  Patrick Morgan on 12/30/22 by a audio enabled telemedicine application and verified that I am speaking with the correct person using two identifiers.  Patient Location: Home  Provider Location: Office/Clinic  I discussed the limitations of evaluation and management by telemedicine. The patient expressed understanding and agreed to proceed.  Review of Systems    Defer to PCP  Cardiac Risk Factors include: advanced age (>39men, >40 women);hypertension     Objective:    Today's Vitals   12/30/22 1126  Height: 5' 10.5" (1.791 m)   Body mass index is 28.86 kg/m.     12/30/2022   11:33 AM 12/22/2021    1:40 PM 06/08/2015    9:16 AM  Advanced Directives  Does Patient Have a Medical Advance Directive? Yes Yes No  Type of Advance Directive Living will Living will;Healthcare Power of Attorney   Does patient want to make changes to medical advance directive? No - Patient declined No - Patient declined   Copy of Healthcare Power of Attorney in Chart?  No - copy requested   Would patient like information on creating a medical advance directive? No - Patient declined      Current Medications (verified) Outpatient Encounter Medications as of 12/30/2022  Medication Sig   aspirin 81 MG tablet Take 81 mg by mouth daily.   metoprolol tartrate (LOPRESSOR) 25 MG tablet TAKE 1 TABLET(25 MG) BY MOUTH TWICE DAILY   Multiple Vitamin (MULTIVITAMIN) tablet Take 1 tablet by mouth daily.   Olmesartan-amLODIPine-HCTZ (TRIBENZOR) 40-10-25 MG TABS Take 1 tablet by mouth 1 day or 1 dose.   traMADol (ULTRAM) 50 MG tablet TAKE 1 TO 2 TABLETS BY MOUTH DAILY FOR PAIN   VITAMIN D PO Take 2,000 Units by mouth daily.   No facility-administered encounter medications on file as of 12/30/2022.    Allergies (verified) Codeine and Telmisartan    History: Past Medical History:  Diagnosis Date   Hypertension    PAC (premature atrial contraction)    PVC's (premature ventricular contractions)    Vertigo    Past Surgical History:  Procedure Laterality Date   NO PAST SURGERIES     Family History  Problem Relation Age of Onset   Alzheimer's disease Mother    Cancer Father 85       bladder ca   Diabetes Father    Social History   Socioeconomic History   Marital status: Married    Spouse name: Not on file   Number of children: Not on file   Years of education: Not on file   Highest education level: Not on file  Occupational History   Not on file  Tobacco Use   Smoking status: Former   Smokeless tobacco: Never   Tobacco comments:    quit 1975  Vaping Use   Vaping Use: Never used  Substance and Sexual Activity   Alcohol use: No    Alcohol/week: 0.0 standard drinks of alcohol    Comment: quit 2002   Drug use: No   Sexual activity: Yes  Other Topics Concern   Not on file  Social History Narrative   Not on file   Social Determinants of Health   Financial Resource Strain: Low Risk  (12/30/2022)   Overall Financial Resource Strain (CARDIA)    Difficulty of Paying Living Expenses: Not hard at all  Food Insecurity: No Food Insecurity (12/30/2022)   Hunger Vital Sign    Worried About Running Out of Food in the Last Year: Never true    Ran Out of Food in the Last Year: Never true  Transportation Needs: No Transportation Needs (12/30/2022)   PRAPARE - Administrator, Civil Service (Medical): No    Lack of Transportation (Non-Medical): No  Physical Activity: Sufficiently Active (12/30/2022)   Exercise Vital Sign    Days of Exercise per Week: 5 days    Minutes of Exercise per Session: 40 min  Stress: No Stress Concern Present (12/30/2022)   [employer] of Occupational Health - Occupational Stress Questionnaire    Feeling of Stress : Not at all  Social Connections: Socially Integrated (12/30/2022)    Social Connection and Isolation Panel [NHANES]    Frequency of Communication with Friends and Family: Three times a week    Frequency of Social Gatherings with Friends and Family: Three times a week    Attends Religious Services: More than 4 times per year    Active Member of Clubs or Organizations: Yes    Attends Engineer, structural: More than 4 times per year    Marital Status: Married    Tobacco Counseling Counseling given: Not Answered Tobacco comments: quit 1975   Clinical Intake:  Pre-visit preparation completed: Yes  Pain : No/denies pain     BMI - recorded: 28.85 Nutritional Status: BMI 25 -29 Overweight Nutritional Risks: None Diabetes: No  How often do you need to have someone help you when you read instructions, pamphlets, or other written materials from your doctor or pharmacy?: 1 - Never What is the last grade level you completed in school?: 2 years in trade school  Interpreter Needed?: No      Activities of Daily Living    12/30/2022   11:29 AM  In your present state of health, do you have any difficulty performing the following activities:  Hearing? 0  Vision? 0  Difficulty concentrating or making decisions? 0  Walking or climbing stairs? 0  Dressing or bathing? 0  Doing errands, shopping? 0  Preparing Food and eating ? N  Using the Toilet? N  In the past six months, have you accidently leaked urine? N  Do you have problems with loss of bowel control? N  Managing your Medications? N  Managing your Finances? N  Housekeeping or managing your Housekeeping? N    Patient Care Team: Plotnikov, Georgina Quint, MD as PCP - General (Internal Medicine) Kathleene Hazel, MD as PCP - Cardiology (Cardiology) Marcine Matar, MD as Consulting Physician (Urology) Kathleene Hazel, MD as Consulting Physician (Cardiology) [facility] [employer] as Consulting Physician (Optometry)  Indicate any recent Medical Services you may have  received from other than [facility] providers in the past year (date may be approximate).     Assessment:   This is a routine wellness examination for Patrick Morgan.  Hearing/Vision screen Vision Screening - Comments:: Sees eye care provider every 2 years  Dietary issues and exercise activities discussed:     Goals Addressed               This Visit's Progress     Patient Stated (pt-stated)        He has no health goals this year       Depression Screen    12/30/2022   11:32 AM 09/30/2022    8:27 AM 12/22/2021    1:39 PM 09/18/2020  9:44 AM 10/07/2017    9:11 AM 10/06/2016    9:47 AM 07/11/2015   10:51 AM  PHQ 2/9 Scores  PHQ - 2 Score 0 0 0 0 0 0 0  PHQ- 9 Score    0       Fall Risk    12/30/2022   11:34 AM 09/30/2022    8:27 AM 12/22/2021    1:43 PM 09/18/2020    9:44 AM 05/23/2019   12:03 PM  Fall Risk   Falls in the past year? 0 0 0 0 0  Comment     Emmi Telephone Survey: data to providers prior to load  Number falls in past yr: 0 0 0 0   Injury with Fall? 0 0 0 0   Risk for fall due to : No Fall Risks No Fall Risks No Fall Risks No Fall Risks   Follow up Falls evaluation completed Falls evaluation completed Falls evaluation completed      MEDICARE RISK AT HOME:  Medicare Risk at Home - 12/30/22 1134     Any stairs in or around the home? Yes    If so, are there any without handrails? Yes    Home free of loose throw rugs in walkways, pet beds, electrical cords, etc? Yes    Adequate lighting in your home to reduce risk of falls? Yes    Life alert? No    Use of a cane, walker or w/c? No    Grab bars in the bathroom? Yes    Shower chair or bench in shower? No    Elevated toilet seat or a handicapped toilet? Yes             TIMED UP AND GO:  Was the test performed?  No    Cognitive Function:        12/30/2022   11:35 AM 12/22/2021    1:44 PM  6CIT Screen  What Year? 0 points 0 points  What month? 0 points 0 points  What time? 0 points 0 points  Count back  from 20 0 points 0 points  Months in reverse 0 points 0 points  Repeat phrase 4 points 0 points  Total Score 4 points 0 points    Immunizations Immunization History  Administered Date(s) Administered   Fluad Quad(high Dose 65+) 03/21/2019   Influenza Whole 02/28/2012, 04/05/2013   Influenza, High Dose Seasonal PF 03/25/2015, 03/23/2017, 03/10/2018, 03/20/2022   Influenza,inj,Quad PF,6+ Mos 03/13/2016   Moderna SARS-COV2 Booster Vaccination 05/07/2020, 01/14/2021, 04/04/2021   Moderna Sars-Covid-2 Vaccination 07/07/2019, 08/04/2019   Pneumococcal Conjugate-13 06/14/2013   Pneumococcal Polysaccharide-23 06/29/2009   Td 06/09/2012   Zoster, Live 06/29/2009    TDAP status: Due, Education has been provided regarding the importance of this vaccine. Advised may receive this vaccine at local pharmacy or Health Dept. Aware to provide a copy of the vaccination record if obtained from local pharmacy or Health Dept. Verbalized acceptance and understanding.  Flu Vaccine status: Up to date  Pneumococcal vaccine status: Declined,  Education has been provided regarding the importance of this vaccine but patient still declined. Advised may receive this vaccine at local pharmacy or Health Dept. Aware to provide a copy of the vaccination record if obtained from local pharmacy or Health Dept. Verbalized acceptance and understanding.   Covid-19 vaccine status: Completed vaccines  Qualifies for Shingles Vaccine? No   Zostavax completed No   Shingrix Completed?: No.    Education has been provided regarding the importance of  this vaccine. Patient has been advised to call insurance company to determine out of pocket expense if they have not yet received this vaccine. Advised may also receive vaccine at local pharmacy or Health Dept. Verbalized acceptance and understanding.  Screening Tests Health Maintenance  Topic Date Due   Pneumonia Vaccine 24+ Years old (3 of 3 - PPSV23 or PCV20) 06/14/2018    COVID-19 Vaccine (6 - 2023-24 season) 02/27/2022   DTaP/Tdap/Td (2 - Tdap) 06/09/2022   INFLUENZA VACCINE  01/28/2023   Medicare Annual Wellness (AWV)  12/30/2023   Hepatitis C Screening  Completed   HPV VACCINES  Aged Out   Colonoscopy  Discontinued   Zoster Vaccines- Shingrix  Discontinued    Health Maintenance  Health Maintenance Due  Topic Date Due   Pneumonia Vaccine 72+ Years old (3 of 3 - PPSV23 or PCV20) 06/14/2018   COVID-19 Vaccine (6 - 2023-24 season) 02/27/2022   DTaP/Tdap/Td (2 - Tdap) 06/09/2022    Colorectal cancer screening: Type of screening: Colonoscopy. Completed 02/19/16. Repeat every 10 years  Lung Cancer Screening: (Low Dose CT Chest recommended if Age 36-80 years, 20 pack-year currently smoking OR have quit w/in 15years.) does not qualify.   Lung Cancer Screening Referral: N/A  Additional Screening:  Hepatitis C Screening: does qualify; Completed 10/09/2015  Vision Screening: Recommended annual ophthalmology exams for early detection of glaucoma and other disorders of the eye. Is the patient up to date with their annual eye exam?  No  Who is the provider or what is the name of the office in which the patient attends annual eye exams? Pt is not sure  If pt is not established with a provider, would they like to be referred to a provider to establish care? No .   Dental Screening: Recommended annual dental exams for proper oral hygiene   Community Resource Referral / Chronic Care Management: CRR required this visit?  No   CCM required this visit?  No     Plan:     I have personally reviewed and noted the following in the patient's chart:   Medical and social history Use of alcohol, tobacco or illicit drugs  Current medications and supplements including opioid prescriptions. Patient is not currently taking opioid prescriptions. Functional ability and status Nutritional status Physical activity Advanced directives List of other  physicians Hospitalizations, surgeries, and ER visits in previous 12 months Vitals Screenings to include cognitive, depression, and falls Referrals and appointments  In addition, I have reviewed and discussed with patient certain preventive protocols, quality metrics, and best practice recommendations. A written personalized care plan for preventive services as well as general preventive health recommendations were provided to patient.     Young Berry Sharian Delia, CMA   12/30/2022   After Visit Summary: (Mail) Due to this being a telephonic visit, the after visit summary with patients personalized plan was offered to patient via mail   Nurse Notes:  Mr. Bethard , Thank you for taking time to come for your Medicare Wellness Visit. I appreciate your ongoing commitment to your health goals. Please review the following plan we discussed and let me know if I can assist you in the future.   These are the goals we discussed:  Goals       Patient Stated (pt-stated)      He has no health goals this year       To maintain my current health status by continuing to eat healthy, stay physically active and socially active.  This is a list of the screening recommended for you and due dates:  Health Maintenance  Topic Date Due   Pneumonia Vaccine (3 of 3 - PPSV23 or PCV20) 06/14/2018   COVID-19 Vaccine (6 - 2023-24 season) 02/27/2022   DTaP/Tdap/Td vaccine (2 - Tdap) 06/09/2022   Flu Shot  01/28/2023   Medicare Annual Wellness Visit  12/30/2023   Hepatitis C Screening  Completed   HPV Vaccine  Aged Out   Colon Cancer Screening  Discontinued   Zoster (Shingles) Vaccine  Discontinued     Medical screening examination/treatment/procedure(s) were performed by non-physician practitioner and as supervising physician I was immediately available for consultation/collaboration.  I agree with above. Jacinta Shoe, MD

## 2023-02-10 ENCOUNTER — Other Ambulatory Visit: Payer: Self-pay | Admitting: Internal Medicine

## 2023-03-17 ENCOUNTER — Ambulatory Visit: Payer: Medicare Other | Admitting: Cardiology

## 2023-03-31 ENCOUNTER — Encounter: Payer: Self-pay | Admitting: Internal Medicine

## 2023-03-31 ENCOUNTER — Ambulatory Visit: Payer: Medicare Other | Admitting: Internal Medicine

## 2023-03-31 VITALS — BP 112/68 | HR 70 | Temp 98.6°F | Ht 70.5 in | Wt 200.0 lb

## 2023-03-31 DIAGNOSIS — R972 Elevated prostate specific antigen [PSA]: Secondary | ICD-10-CM

## 2023-03-31 DIAGNOSIS — I10 Essential (primary) hypertension: Secondary | ICD-10-CM

## 2023-03-31 DIAGNOSIS — M5440 Lumbago with sciatica, unspecified side: Secondary | ICD-10-CM | POA: Diagnosis not present

## 2023-03-31 DIAGNOSIS — R002 Palpitations: Secondary | ICD-10-CM | POA: Diagnosis not present

## 2023-03-31 LAB — COMPREHENSIVE METABOLIC PANEL
ALT: 15 U/L (ref 0–53)
AST: 19 U/L (ref 0–37)
Albumin: 4.6 g/dL (ref 3.5–5.2)
Alkaline Phosphatase: 62 U/L (ref 39–117)
BUN: 19 mg/dL (ref 6–23)
CO2: 25 meq/L (ref 19–32)
Calcium: 9.7 mg/dL (ref 8.4–10.5)
Chloride: 101 meq/L (ref 96–112)
Creatinine, Ser: 1.08 mg/dL (ref 0.40–1.50)
GFR: 66.4 mL/min (ref >60.00)
Glucose, Bld: 101 mg/dL — ABNORMAL HIGH (ref 70–99)
Potassium: 4.1 meq/L (ref 3.5–5.1)
Sodium: 137 meq/L (ref 135–145)
Total Bilirubin: 0.8 mg/dL (ref 0.2–1.2)
Total Protein: 7.5 g/dL (ref 6.0–8.3)

## 2023-03-31 LAB — URINALYSIS
Bilirubin Urine: NEGATIVE
Hgb urine dipstick: NEGATIVE
Ketones, ur: NEGATIVE
Leukocytes,Ua: NEGATIVE
Nitrite: NEGATIVE
Specific Gravity, Urine: 1.02 (ref 1.000–1.030)
Total Protein, Urine: NEGATIVE
Urine Glucose: NEGATIVE
Urobilinogen, UA: 1 (ref 0.0–1.0)
pH: 6 (ref 5.0–8.0)

## 2023-03-31 LAB — LIPID PANEL
Cholesterol: 192 mg/dL (ref 0–200)
HDL: 48.9 mg/dL (ref >39.00)
LDL Cholesterol: 124 mg/dL — ABNORMAL HIGH (ref 0–99)
NonHDL: 142.62
Total CHOL/HDL Ratio: 4
Triglycerides: 95 mg/dL (ref 0.0–149.0)
VLDL: 19 mg/dL (ref 0.0–40.0)

## 2023-03-31 LAB — CBC WITH DIFFERENTIAL/PLATELET
Basophils Absolute: 0 10*3/uL (ref 0.0–0.1)
Basophils Relative: 0.5 % (ref 0.0–3.0)
Eosinophils Absolute: 0.1 10*3/uL (ref 0.0–0.7)
Eosinophils Relative: 1.3 % (ref 0.0–5.0)
HCT: 40.7 % (ref 39.0–52.0)
Hemoglobin: 13.9 g/dL (ref 13.0–17.0)
Lymphocytes Relative: 22.1 % (ref 12.0–46.0)
Lymphs Abs: 1.7 10*3/uL (ref 0.7–4.0)
MCHC: 34.1 g/dL (ref 30.0–36.0)
MCV: 103.8 fL — ABNORMAL HIGH (ref 78.0–100.0)
Monocytes Absolute: 0.7 10*3/uL (ref 0.1–1.0)
Monocytes Relative: 8.8 % (ref 3.0–12.0)
Neutro Abs: 5.3 10*3/uL (ref 1.4–7.7)
Neutrophils Relative %: 67.3 % (ref 43.0–77.0)
Platelets: 213 10*3/uL (ref 150.0–400.0)
RBC: 3.92 Mil/uL — ABNORMAL LOW (ref 4.22–5.81)
RDW: 12.6 % (ref 11.5–15.5)
WBC: 7.9 10*3/uL (ref 4.0–10.5)

## 2023-03-31 LAB — PSA: PSA: 5.64 ng/mL — ABNORMAL HIGH (ref 0.10–4.00)

## 2023-03-31 LAB — TSH: TSH: 1.3 u[IU]/mL (ref 0.35–5.50)

## 2023-03-31 NOTE — Assessment & Plan Note (Addendum)
On Tribenzor, Metoprolol I suggested carotid US, coronary calcium CT score - pt declined Dr Clifton James q 12 mo

## 2023-03-31 NOTE — Assessment & Plan Note (Addendum)
Monitoring PSA Elevated PSA today.  Repeat PSA and free PSA in 2 months.

## 2023-03-31 NOTE — Assessment & Plan Note (Signed)
No relapse On Tribenzor, Metoprolol I suggested carotid US, cor calcium CT score - pt declined Dr Clifton James q 12 mo

## 2023-03-31 NOTE — Addendum Note (Signed)
Addended by: Tresa Garter on: 03/31/2023 10:52 PM   Modules accepted: Orders, Level of Service

## 2023-03-31 NOTE — Assessment & Plan Note (Addendum)
Using Olive oil and lemon juice 1 tbsp daily On Tramadol prn

## 2023-03-31 NOTE — Progress Notes (Addendum)
Subjective:  Patient ID: Patrick Morgan, male    DOB: 07-10-1945  Age: 77 y.o. MRN: [id number]  CC: Follow-up (6 MNTH F/U)   HPI AYCEN PORRECA presents for HTN, OA, LBP Using Olive oil and lemon juice 1 tbsp daily  Outpatient Medications Prior to Visit  Medication Sig Dispense Refill   aspirin 81 MG tablet Take 81 mg by mouth daily.     metoprolol tartrate (LOPRESSOR) 25 MG tablet TAKE 1 TABLET(25 MG) BY MOUTH TWICE DAILY 180 tablet 1   Multiple Vitamin (MULTIVITAMIN) tablet Take 1 tablet by mouth daily.     Olmesartan-amLODIPine-HCTZ (TRIBENZOR) 40-10-25 MG TABS Take 1 tablet by mouth 1 day or 1 dose. 90 tablet 3   traMADol (ULTRAM) 50 MG tablet TAKE 1 TO 2 TABLETS BY MOUTH DAILY FOR PAIN 120 tablet 3   VITAMIN D PO Take 2,000 Units by mouth daily.     No facility-administered medications prior to visit.    ROS: Review of Systems  Constitutional:  Negative for appetite change, fatigue and unexpected weight change.  HENT:  Negative for congestion, nosebleeds, sneezing, sore throat and trouble swallowing.   Eyes:  Negative for itching and visual disturbance.  Respiratory:  Negative for cough.   Cardiovascular:  Negative for chest pain, palpitations and leg swelling.  Gastrointestinal:  Negative for abdominal distention, blood in stool, diarrhea and nausea.  Genitourinary:  Negative for frequency and hematuria.  Musculoskeletal:  Positive for arthralgias and back pain. Negative for gait problem, joint swelling and neck pain.  Skin:  Negative for rash.  Neurological:  Negative for dizziness, tremors, speech difficulty and weakness.  Psychiatric/Behavioral:  Negative for agitation, dysphoric mood and sleep disturbance. The patient is not nervous/anxious.     Objective:  BP 112/68 (BP Location: Left Arm, Patient Position: Sitting, Cuff Size: Normal)   Pulse 70   Temp 98.6 F (37 C) (Oral)   Ht 5' 10.5" (1.791 m)   Wt 200 lb (90.7 kg)   SpO2 96%   BMI 28.29 kg/m   BP  Readings from Last 3 Encounters:  03/31/23 112/68  09/30/22 (!) 150/60  03/25/22 (!) 150/68    Wt Readings from Last 3 Encounters:  03/31/23 200 lb (90.7 kg)  09/30/22 204 lb (92.5 kg)  03/25/22 206 lb 3.2 oz (93.5 kg)    Physical Exam Constitutional:      General: He is not in acute distress.    Appearance: Normal appearance. He is well-developed.     Comments: NAD  Eyes:     Conjunctiva/sclera: Conjunctivae normal.     Pupils: Pupils are equal, round, and reactive to light.  Neck:     Thyroid: No thyromegaly.     Vascular: No JVD.  Cardiovascular:     Rate and Rhythm: Normal rate and regular rhythm.     Heart sounds: Normal heart sounds. No murmur heard.    No friction rub. No gallop.  Pulmonary:     Effort: Pulmonary effort is normal. No respiratory distress.     Breath sounds: Normal breath sounds. No wheezing or rales.  Chest:     Chest wall: No tenderness.  Abdominal:     General: Bowel sounds are normal. There is no distension.     Palpations: Abdomen is soft. There is no mass.     Tenderness: There is no abdominal tenderness. There is no guarding or rebound.  Musculoskeletal:        General: No tenderness. Normal range of motion.  Cervical back: Normal range of motion.  Lymphadenopathy:     Cervical: No cervical adenopathy.  Skin:    General: Skin is warm and dry.     Findings: No rash.  Neurological:     Mental Status: He is alert and oriented to person, place, and time.     Cranial Nerves: No cranial nerve deficit.     Motor: No abnormal muscle tone.     Coordination: Coordination normal.     Gait: Gait normal.     Deep Tendon Reflexes: Reflexes are normal and symmetric.  Psychiatric:        Behavior: Behavior normal.        Thought Content: Thought content normal.        Judgment: Judgment normal.     Lab Results  Component Value Date   WBC 7.9 03/31/2023   HGB 13.9 03/31/2023   HCT 40.7 03/31/2023   PLT 213.0 03/31/2023   GLUCOSE 101 (H)  03/31/2023   CHOL 192 03/31/2023   TRIG 95.0 03/31/2023   HDL 48.90 03/31/2023   LDLCALC 124 (H) 03/31/2023   ALT 15 03/31/2023   AST 19 03/31/2023   NA 137 03/31/2023   K 4.1 03/31/2023   CL 101 03/31/2023   CREATININE 1.08 03/31/2023   BUN 19 03/31/2023   CO2 25 03/31/2023   TSH 1.30 03/31/2023   PSA 5.64 (H) 03/31/2023   HGBA1C 4.8 09/30/2022    DG Chest 2 View  Addendum Date: 06/08/2015   ADDENDUM REPORT: 06/08/2015 10:30 ADDENDUM: Impression should read:  No edema or consolidation. Electronically Signed   By: Bretta Bang III M.D.   On: 06/08/2015 10:30  Result Date: 06/08/2015 CLINICAL DATA:  Cardiac palpitations for 2 weeks.  Hypertension. EXAM: CHEST  2 VIEW COMPARISON:  None. FINDINGS: Lungs are clear. Heart size and pulmonary vascularity are normal. No adenopathy. There is degenerative change thoracic spine. IMPRESSION: Edema or consolidation. Electronically Signed: By: Bretta Bang III M.D. On: 06/08/2015 10:26    Assessment & Plan:   Problem List Items Addressed This Visit     Hypertension    On Tribenzor, Metoprolol I suggested carotid US, coronary calcium CT score - pt declined Dr Clifton James q 12 mo      Relevant Orders   TSH (Completed)   Urinalysis (Completed)   CBC with Differential/Platelet (Completed)   Lipid panel (Completed)   PSA (Completed)   Comprehensive metabolic panel (Completed)   Elevated PSA - Primary    Monitoring PSA Elevated PSA today.  Repeat PSA and free PSA in 2 months.      Relevant Orders   PSA (Completed)   PSA, total and free   Low back pain    Using Olive oil and lemon juice 1 tbsp daily On Tramadol prn      Relevant Orders   Urinalysis (Completed)   Palpitations    No relapse On Tribenzor, Metoprolol I suggested carotid US, cor calcium CT score - pt declined Dr Clifton James q 12 mo      Relevant Orders   TSH (Completed)   Urinalysis (Completed)   CBC with Differential/Platelet (Completed)   Lipid panel  (Completed)   PSA (Completed)   Comprehensive metabolic panel (Completed)      No orders of the defined types were placed in this encounter.     Follow-up: Return in about 6 months (around 09/29/2023) for a follow-up visit.  Sonda Primes, MD

## 2023-04-02 DIAGNOSIS — Z23 Encounter for immunization: Secondary | ICD-10-CM | POA: Diagnosis not present

## 2023-05-18 ENCOUNTER — Other Ambulatory Visit: Payer: Self-pay | Admitting: Internal Medicine

## 2023-07-13 ENCOUNTER — Ambulatory Visit: Payer: Medicare Other | Admitting: Cardiovascular Disease

## 2023-08-21 ENCOUNTER — Other Ambulatory Visit: Payer: Self-pay | Admitting: Internal Medicine

## 2023-09-18 ENCOUNTER — Other Ambulatory Visit: Payer: Self-pay | Admitting: Internal Medicine

## 2023-09-23 ENCOUNTER — Ambulatory Visit: Attending: Cardiovascular Disease | Admitting: Cardiovascular Disease

## 2023-09-23 ENCOUNTER — Encounter: Payer: Self-pay | Admitting: Cardiovascular Disease

## 2023-09-23 VITALS — BP 142/64 | HR 81 | Ht 70.5 in | Wt 199.6 lb

## 2023-09-23 DIAGNOSIS — I10 Essential (primary) hypertension: Secondary | ICD-10-CM | POA: Diagnosis not present

## 2023-09-23 DIAGNOSIS — R011 Cardiac murmur, unspecified: Secondary | ICD-10-CM | POA: Diagnosis not present

## 2023-09-23 DIAGNOSIS — I493 Ventricular premature depolarization: Secondary | ICD-10-CM | POA: Insufficient documentation

## 2023-09-23 NOTE — Progress Notes (Addendum)
 Evaluation Performed:  Follow-up visit  Date:  09/23/2023   ID:  Patrick Morgan, DOB 14-May-1946, MRN [id number]  PCP:  Tresa Garter, MD  Cardiologist:  Verne Carrow, MD   Chief Complaint: Follow up for HTN  History of Present Illness:    Patrick Morgan is a 78 y.o. male with history of HTN, palpitations and PVCs who is being seen today for cardiac follow up. He had been seen previously for palpitations. EKG with sinus tachycardia and TSH normal in 2016. Echo December 2016 with normal LV systolic function, LVEF 60-65%, no valve disease. 24 hour cardiac monitor in 2016 with sinus rhythm, rare PVCs, frequent PACs. He was started on a beta blocker.    He is here today for follow up. The patient denies any chest pain, dyspnea, palpitations, lower extremity edema, orthopnea, PND, dizziness, near syncope or syncope.   Past Medical History:  Diagnosis Date   Hypertension    PAC (premature atrial contraction)    PVC's (premature ventricular contractions)    Vertigo    Past Surgical History:  Procedure Laterality Date   NO PAST SURGERIES       Current Meds  Medication Sig   aspirin 81 MG tablet Take 81 mg by mouth daily.   metoprolol tartrate (LOPRESSOR) 25 MG tablet TAKE 1 TABLET(25 MG) BY MOUTH TWICE DAILY   Multiple Vitamin (MULTIVITAMIN) tablet Take 1 tablet by mouth daily.   Olmesartan-amLODIPine-HCTZ 40-10-25 MG TABS TAKE 1 TABLET BY MOUTH DAILY   traMADol (ULTRAM) 50 MG tablet TAKE 1 TO 2 TABLETS BY MOUTH DAILY FOR PAIN   VITAMIN D PO Take 2,000 Units by mouth daily.     Allergies:   Codeine and Telmisartan   Social History   Tobacco Use   Smoking status: Former   Smokeless tobacco: Never   Tobacco comments:    quit 1975  Vaping Use   Vaping status: Never Used  Substance Use Topics   Alcohol use: No    Alcohol/week: 0.0 standard drinks of alcohol    Comment: quit 2002   Drug use: No     Family Hx: The patient's family history includes  Alzheimer's disease in his mother; Cancer (age of onset: 59) in his father; Diabetes in his father.  ROS:   Please see the history of present illness.    All other systems reviewed and are negative.  EKG:  EKG is performed today My personal review of the EKG: EKG Interpretation Date/Time:  Thursday September 23 2023 15:23:26 EDT Ventricular Rate:  71 PR Interval:  168 QRS Duration:  90 QT Interval:  378 QTC Calculation: 410 R Axis:   -6  Text Interpretation: Normal sinus rhythm Normal ECG Confirmed by Verne Carrow [phone]) on 09/23/2023 3:38:28 PM     Recent Labs: 03/31/2023: ALT 15; BUN 19; Creatinine, Ser 1.08; Hemoglobin 13.9; Platelets 213.0; Potassium 4.1; Sodium 137; TSH 1.30   Recent Lipid Panel Lab Results  Component Value Date/Time   CHOL 192 03/31/2023 09:16 AM   TRIG 95.0 03/31/2023 09:16 AM   HDL 48.90 03/31/2023 09:16 AM   CHOLHDL 4 03/31/2023 09:16 AM   LDLCALC 124 (H) 03/31/2023 09:16 AM    Wt Readings from Last 3 Encounters:  09/23/23 90.5 kg  03/31/23 90.7 kg  09/30/22 92.5 kg     Objective:    Vital Signs:  BP (!) 142/64   Pulse 81   Ht 5' 10.5" (1.791 m)   Wt 90.5 kg  SpO2 99%   BMI 28.23 kg/m    General: Well developed, well nourished, NAD  HEENT: OP clear, mucus membranes moist  SKIN: warm, dry. No rashes. Neuro: No focal deficits  Musculoskeletal: Muscle strength 5/5 all ext  Psychiatric: Mood and affect normal  Neck: No JVD, no carotid bruits, no thyromegaly, no lymphadenopathy.  Lungs:Clear bilaterally, no wheezes, rhonci, crackles Cardiovascular: Regular rate and rhythm. Soft systolic murmur.  Abdomen:Soft. Bowel sounds present. Non-tender.  Extremities: No lower extremity edema. Pulses are 2 + in the bilateral DP/PT.  ASSESSMENT & PLAN:    1. Palpitations/PACs/PVCs: He is known to have PACs and PVCs by cardiac monitor in January 2017. Echo December 2016 with normal LV function, no valve disease. No palpitations. Continue beta  blocker.        2. HTN: BP is controlled today. Continue current therapy as above.   3. Cardiac murmur: Systolic murmur on exam, soft. Will arrange an echo to assess.   Tests Ordered: Orders Placed This Encounter  Procedures   EKG 12-Lead   ECHOCARDIOGRAM COMPLETE   Disposition:  Follow up in 1 year(s)  Signed, Verne Carrow, MD  09/23/2023 4:17 PM    [facility]

## 2023-09-23 NOTE — Patient Instructions (Signed)
 Medication Instructions:  No changes *If you need a refill on your cardiac medications before your next appointment, please call your pharmacy*  Lab Work: none If you have labs (blood work) drawn today and your tests are completely normal, you will receive your results only by: MyChart Message (if you have MyChart) OR A paper copy in the mail If you have any lab test that is abnormal or we need to change your treatment, we will call you to review the results.  Testing/Procedures: Your physician has requested that you have an echocardiogram. Echocardiography is a painless test that uses sound waves to create images of your heart. It provides your doctor with information about the size and shape of your heart and how well your heart's chambers and valves are working. This procedure takes approximately one hour. There are no restrictions for this procedure. Please do NOT wear cologne, perfume, aftershave, or lotions (deodorant is allowed). Please arrive 15 minutes prior to your appointment time.  Please note: We ask at that you not bring children with you during ultrasound (echo/ vascular) testing. Due to room size and safety concerns, children are not allowed in the ultrasound rooms during exams. Our front office staff cannot provide observation of children in our lobby area while testing is being conducted. An adult accompanying a patient to their appointment will only be allowed in the ultrasound room at the discretion of the ultrasound technician under special circumstances. We apologize for any inconvenience.   Follow-Up: At [facility], you and your health needs are our priority.  As part of our continuing mission to provide you with exceptional heart care, our providers are all part of one team.  This team includes your primary Cardiologist (physician) and Advanced Practice Providers or APPs (Physician Assistants and Nurse Practitioners) who all work together to provide you with the  care you need, when you need it.  Your next appointment:   12 month(s)  Provider:   Verne Carrow, MD     We recommend signing up for the patient portal called "MyChart".  Sign up information is provided on this After Visit Summary.  MyChart is used to connect with patients for Virtual Visits (Telemedicine).  Patients are able to view lab/test results, encounter notes, upcoming appointments, etc.  Non-urgent messages can be sent to your provider as well.   To learn more about what you can do with MyChart, go to [email or web address].      1st Floor: - Lobby - Registration  - Pharmacy  - Lab - Cafe  2nd Floor: - PV Lab - Diagnostic Testing (echo, CT, nuclear med)  3rd Floor: - Vacant  4th Floor: - TCTS (cardiothoracic surgery) - AFib Clinic - Structural Heart Clinic - Vascular Surgery  - Vascular Ultrasound  5th Floor: - HeartCare Cardiology (general and EP) - Clinical Pharmacy for coumadin, hypertension, lipid, weight-loss medications, and med management appointments    Valet parking services will be available as well.

## 2023-09-29 ENCOUNTER — Ambulatory Visit: Payer: Medicare Other | Admitting: Internal Medicine

## 2023-10-18 ENCOUNTER — Ambulatory Visit: Payer: Medicare Other | Admitting: Cardiovascular Disease

## 2023-10-27 ENCOUNTER — Ambulatory Visit (HOSPITAL_COMMUNITY)

## 2023-11-17 ENCOUNTER — Ambulatory Visit (INDEPENDENT_AMBULATORY_CARE_PROVIDER_SITE_OTHER): Admitting: Internal Medicine

## 2023-11-17 ENCOUNTER — Encounter: Payer: Self-pay | Admitting: Internal Medicine

## 2023-11-17 VITALS — BP 114/62 | HR 70 | Temp 98.6°F | Ht 70.5 in | Wt 195.0 lb

## 2023-11-17 DIAGNOSIS — R972 Elevated prostate specific antigen [PSA]: Secondary | ICD-10-CM

## 2023-11-17 DIAGNOSIS — I10 Essential (primary) hypertension: Secondary | ICD-10-CM

## 2023-11-17 DIAGNOSIS — R002 Palpitations: Secondary | ICD-10-CM

## 2023-11-17 MED ORDER — METOPROLOL TARTRATE 25 MG PO TABS: ORAL_TABLET | ORAL | 3 refills | Status: AC

## 2023-11-17 MED ORDER — OLMESARTAN-AMLODIPINE-HCTZ 40-10-25 MG PO TABS: 1.0000 | ORAL_TABLET | Freq: Every day | ORAL | 3 refills | Status: AC

## 2023-11-17 MED ORDER — TRAMADOL HCL 50 MG PO TABS: ORAL_TABLET | ORAL | 2 refills | Status: DC

## 2023-11-17 NOTE — Assessment & Plan Note (Signed)
On Tribenzor, Metoprolol I suggested carotid US, coronary calcium CT score - pt declined Dr Clifton James q 12 mo

## 2023-11-17 NOTE — Assessment & Plan Note (Signed)
No relapse On Tribenzor, Metoprolol I suggested carotid US, cor calcium CT score - pt declined Dr Clifton James q 12 mo

## 2023-11-17 NOTE — Assessment & Plan Note (Signed)
Free PSA 

## 2023-11-17 NOTE — Progress Notes (Unsigned)
   Subjective:  Patient ID: Patrick Morgan, male    DOB: 11/20/1945  Age: 78 y.o. MRN: [id number]  CC: Medical Management of Chronic Issues (6 MNTH F/U)   HPI Patrick Morgan presents for HTN, LBP, elevated PSA  Outpatient Medications Prior to Visit  Medication Sig Dispense Refill  . aspirin 81 MG tablet Take 81 mg by mouth daily.    . metoprolol  tartrate (LOPRESSOR ) 25 MG tablet TAKE 1 TABLET(25 MG) BY MOUTH TWICE DAILY 180 tablet 1  . Multiple Vitamin (MULTIVITAMIN) tablet Take 1 tablet by mouth daily.    . Olmesartan -amLODIPine -HCTZ 40-10-25 MG TABS TAKE 1 TABLET BY MOUTH DAILY 90 tablet 3  . traMADol  (ULTRAM ) 50 MG tablet TAKE 1 TO 2 TABLETS BY MOUTH DAILY FOR PAIN 120 tablet 2  . VITAMIN D  PO Take 2,000 Units by mouth daily.     No facility-administered medications prior to visit.    ROS: Review of Systems  Objective:  BP 114/62   Pulse 70   Temp 98.6 F (37 C) (Oral)   Ht 5' 10.5" (1.791 m)   Wt 195 lb (88.5 kg)   SpO2 97%   BMI 27.58 kg/m   BP Readings from Last 3 Encounters:  11/17/23 114/62  09/23/23 (!) 142/64  03/31/23 112/68    Wt Readings from Last 3 Encounters:  11/17/23 195 lb (88.5 kg)  09/23/23 199 lb 9.6 oz (90.5 kg)  03/31/23 200 lb (90.7 kg)    Physical Exam  Lab Results  Component Value Date   WBC 7.9 03/31/2023   HGB 13.9 03/31/2023   HCT 40.7 03/31/2023   PLT 213.0 03/31/2023   GLUCOSE 101 (H) 03/31/2023   CHOL 192 03/31/2023   TRIG 95.0 03/31/2023   HDL 48.90 03/31/2023   LDLCALC 124 (H) 03/31/2023   ALT 15 03/31/2023   AST 19 03/31/2023   NA 137 03/31/2023   K 4.1 03/31/2023   CL 101 03/31/2023   CREATININE 1.08 03/31/2023   BUN 19 03/31/2023   CO2 25 03/31/2023   TSH 1.30 03/31/2023   PSA 5.64 (H) 03/31/2023   HGBA1C 4.8 09/30/2022    DG Chest 2 View Addendum Date: 06/08/2015 ADDENDUM REPORT: 06/08/2015 10:30 ADDENDUM: Impression should read:  No edema or consolidation. Electronically Signed   By: Mordecai Applebaum III  M.D.   On: 06/08/2015 10:30  Result Date: 06/08/2015 CLINICAL DATA:  Cardiac palpitations for 2 weeks.  Hypertension. EXAM: CHEST  2 VIEW COMPARISON:  None. FINDINGS: Lungs are clear. Heart size and pulmonary vascularity are normal. No adenopathy. There is degenerative change thoracic spine. IMPRESSION: Edema or consolidation. Electronically Signed: By: Mordecai Applebaum III M.D. On: 06/08/2015 10:26    Assessment & Plan:   Problem List Items Addressed This Visit   None     No orders of the defined types were placed in this encounter.     Follow-up: No follow-ups on file.  Anitra Barn, MD

## 2023-11-19 LAB — PSA, TOTAL AND FREE
PSA, % Free: 29 % (ref >25)
PSA, Free: 1 ng/mL
PSA, Total: 3.4 ng/mL (ref <=4.0)

## 2023-11-28 ENCOUNTER — Ambulatory Visit: Payer: Self-pay | Admitting: Internal Medicine

## 2023-12-08 ENCOUNTER — Ambulatory Visit (HOSPITAL_COMMUNITY)

## 2024-01-31 ENCOUNTER — Telehealth (HOSPITAL_COMMUNITY): Payer: Self-pay | Admitting: Cardiovascular Disease

## 2024-01-31 NOTE — Telephone Encounter (Signed)
 Patient called to cancel echocardiogram x 3 for the reason below:  01/31/2024 8:45 AM Ab:FRWZPO, CHLOE R  Cancel Rsn: Conflict/Need to Reschedule (pt wife called in stating pt is out of town and will c/b to r/s) x 3   Order will be removed from the active echo WQ and if patient calls back to reschedule we will reinstate the order. Thank you.

## 2024-02-02 ENCOUNTER — Ambulatory Visit (HOSPITAL_COMMUNITY)

## 2024-04-25 DIAGNOSIS — Z23 Encounter for immunization: Secondary | ICD-10-CM | POA: Diagnosis not present

## 2024-05-17 ENCOUNTER — Encounter: Payer: Self-pay | Admitting: Internal Medicine

## 2024-05-17 ENCOUNTER — Ambulatory Visit: Admitting: Internal Medicine

## 2024-05-17 VITALS — BP 126/58 | HR 68 | Temp 98.3°F | Ht 70.5 in | Wt 199.6 lb

## 2024-05-17 DIAGNOSIS — R739 Hyperglycemia, unspecified: Secondary | ICD-10-CM | POA: Diagnosis not present

## 2024-05-17 DIAGNOSIS — I10 Essential (primary) hypertension: Secondary | ICD-10-CM

## 2024-05-17 DIAGNOSIS — M5440 Lumbago with sciatica, unspecified side: Secondary | ICD-10-CM

## 2024-05-17 DIAGNOSIS — H8110 Benign paroxysmal vertigo, unspecified ear: Secondary | ICD-10-CM | POA: Diagnosis not present

## 2024-05-17 DIAGNOSIS — R972 Elevated prostate specific antigen [PSA]: Secondary | ICD-10-CM | POA: Diagnosis not present

## 2024-05-17 DIAGNOSIS — Z0000 Encounter for general adult medical examination without abnormal findings: Secondary | ICD-10-CM

## 2024-05-17 LAB — LIPID PANEL
Cholesterol: 165 mg/dL (ref 0–200)
HDL: 40.4 mg/dL (ref >39.00)
LDL Cholesterol: 107 mg/dL — ABNORMAL HIGH (ref 0–99)
NonHDL: 124.51
Total CHOL/HDL Ratio: 4
Triglycerides: 89 mg/dL (ref 0.0–149.0)
VLDL: 17.8 mg/dL (ref 0.0–40.0)

## 2024-05-17 LAB — COMPREHENSIVE METABOLIC PANEL WITH GFR
ALT: 16 U/L (ref 0–53)
AST: 19 U/L (ref 0–37)
Albumin: 4.5 g/dL (ref 3.5–5.2)
Alkaline Phosphatase: 56 U/L (ref 39–117)
BUN: 19 mg/dL (ref 6–23)
CO2: 27 meq/L (ref 19–32)
Calcium: 9.7 mg/dL (ref 8.4–10.5)
Chloride: 103 meq/L (ref 96–112)
Creatinine, Ser: 1.09 mg/dL (ref 0.40–1.50)
GFR: 65.15 mL/min (ref >60.00)
Glucose, Bld: 100 mg/dL — ABNORMAL HIGH (ref 70–99)
Potassium: 4.1 meq/L (ref 3.5–5.1)
Sodium: 139 meq/L (ref 135–145)
Total Bilirubin: 0.8 mg/dL (ref 0.2–1.2)
Total Protein: 7.3 g/dL (ref 6.0–8.3)

## 2024-05-17 LAB — URINALYSIS
Bilirubin Urine: NEGATIVE
Hgb urine dipstick: NEGATIVE
Ketones, ur: NEGATIVE
Leukocytes,Ua: NEGATIVE
Nitrite: NEGATIVE
Specific Gravity, Urine: 1.015 (ref 1.000–1.030)
Total Protein, Urine: NEGATIVE
Urine Glucose: NEGATIVE
Urobilinogen, UA: 1 (ref 0.0–1.0)
pH: 6 (ref 5.0–8.0)

## 2024-05-17 LAB — CBC WITH DIFFERENTIAL/PLATELET
Basophils Absolute: 0 K/uL (ref 0.0–0.1)
Basophils Relative: 0.6 % (ref 0.0–3.0)
Eosinophils Absolute: 0.2 K/uL (ref 0.0–0.7)
Eosinophils Relative: 2.4 % (ref 0.0–5.0)
HCT: 39.9 % (ref 39.0–52.0)
Hemoglobin: 13.9 g/dL (ref 13.0–17.0)
Lymphocytes Relative: 24.6 % (ref 12.0–46.0)
Lymphs Abs: 1.6 K/uL (ref 0.7–4.0)
MCHC: 34.7 g/dL (ref 30.0–36.0)
MCV: 101.7 fl — ABNORMAL HIGH (ref 78.0–100.0)
Monocytes Absolute: 0.5 K/uL (ref 0.1–1.0)
Monocytes Relative: 8.4 % (ref 3.0–12.0)
Neutro Abs: 4.1 K/uL (ref 1.4–7.7)
Neutrophils Relative %: 64 % (ref 43.0–77.0)
Platelets: 193 K/uL (ref 150.0–400.0)
RBC: 3.92 Mil/uL — ABNORMAL LOW (ref 4.22–5.81)
RDW: 12.7 % (ref 11.5–15.5)
WBC: 6.5 K/uL (ref 4.0–10.5)

## 2024-05-17 LAB — TSH: TSH: 1.2 u[IU]/mL (ref 0.35–5.50)

## 2024-05-17 LAB — PSA: PSA: 4.24 ng/mL — ABNORMAL HIGH (ref 0.10–4.00)

## 2024-05-17 MED ORDER — TRAMADOL HCL 50 MG PO TABS: ORAL_TABLET | ORAL | 2 refills | Status: AC

## 2024-05-17 MED ORDER — MELOXICAM 15 MG PO TABS: 15.0000 mg | ORAL_TABLET | Freq: Every day | ORAL | 2 refills | Status: DC | PRN

## 2024-05-17 NOTE — Progress Notes (Signed)
 Subjective:  Patient ID: Patrick Morgan, male    DOB: 1946-01-14  Age: 78 y.o. MRN: [id number]  CC: Annual Exam (Annual Exam)   HPI KANNEN MOXEY presents for OA, LBP, HTN Well exam  Outpatient Medications Prior to Visit  Medication Sig Dispense Refill   aspirin 81 MG tablet Take 81 mg by mouth daily.     metoprolol  tartrate (LOPRESSOR ) 25 MG tablet TAKE 1 TABLET(25 MG) BY MOUTH TWICE DAILY 180 tablet 3   Multiple Vitamin (MULTIVITAMIN) tablet Take 1 tablet by mouth daily.     Olmesartan -amLODIPine -HCTZ 40-10-25 MG TABS Take 1 tablet by mouth daily. 90 tablet 3   VITAMIN D  PO Take 2,000 Units by mouth daily.     traMADol  (ULTRAM ) 50 MG tablet TAKE 1 TO 2 TABLETS BY MOUTH DAILY FOR PAIN 120 tablet 2   No facility-administered medications prior to visit.    ROS: Review of Systems  Constitutional:  Negative for appetite change, fatigue and unexpected weight change.  HENT:  Negative for congestion, nosebleeds, sneezing, sore throat and trouble swallowing.   Eyes:  Negative for itching and visual disturbance.  Respiratory:  Negative for cough.   Cardiovascular:  Negative for chest pain, palpitations and leg swelling.  Gastrointestinal:  Negative for abdominal distention, blood in stool, diarrhea and nausea.  Genitourinary:  Negative for frequency and hematuria.  Musculoskeletal:  Positive for arthralgias and back pain. Negative for gait problem, joint swelling and neck pain.  Skin:  Negative for rash.  Neurological:  Negative for dizziness, tremors, speech difficulty and weakness.  Psychiatric/Behavioral:  Negative for agitation, dysphoric mood and sleep disturbance. The patient is not nervous/anxious.     Objective:  BP (!) 126/58   Pulse 68   Temp 98.3 F (36.8 C)   Ht 5' 10.5 (1.791 m)   Wt 199 lb 9.6 oz (90.5 kg)   SpO2 99%   BMI 28.23 kg/m   BP Readings from Last 3 Encounters:  05/17/24 (!) 126/58  11/17/23 114/62  09/23/23 (!) 142/64    Wt Readings from Last  3 Encounters:  05/17/24 199 lb 9.6 oz (90.5 kg)  11/17/23 195 lb (88.5 kg)  09/23/23 199 lb 9.6 oz (90.5 kg)    Physical Exam Constitutional:      General: He is not in acute distress.    Appearance: Normal appearance. He is well-developed.     Comments: NAD  Eyes:     Conjunctiva/sclera: Conjunctivae normal.     Pupils: Pupils are equal, round, and reactive to light.  Neck:     Thyroid : No thyromegaly.     Vascular: No JVD.  Cardiovascular:     Rate and Rhythm: Normal rate and regular rhythm.     Heart sounds: Normal heart sounds. No murmur heard.    No friction rub. No gallop.  Pulmonary:     Effort: Pulmonary effort is normal. No respiratory distress.     Breath sounds: Normal breath sounds. No wheezing or rales.  Chest:     Chest wall: No tenderness.  Abdominal:     General: Bowel sounds are normal. There is no distension.     Palpations: Abdomen is soft. There is no mass.     Tenderness: There is no abdominal tenderness. There is no guarding or rebound.  Musculoskeletal:        General: No tenderness. Normal range of motion.     Cervical back: Normal range of motion.  Lymphadenopathy:     Cervical: No  cervical adenopathy.  Skin:    General: Skin is warm and dry.     Findings: No rash.  Neurological:     Mental Status: He is alert and oriented to person, place, and time.     Cranial Nerves: No cranial nerve deficit.     Motor: No abnormal muscle tone.     Coordination: Coordination normal.     Gait: Gait normal.     Deep Tendon Reflexes: Reflexes are normal and symmetric.  Psychiatric:        Behavior: Behavior normal.        Thought Content: Thought content normal.        Judgment: Judgment normal.     Lab Results  Component Value Date   WBC 7.9 03/31/2023   HGB 13.9 03/31/2023   HCT 40.7 03/31/2023   PLT 213.0 03/31/2023   GLUCOSE 101 (H) 03/31/2023   CHOL 192 03/31/2023   TRIG 95.0 03/31/2023   HDL 48.90 03/31/2023   LDLCALC 124 (H) 03/31/2023    ALT 15 03/31/2023   AST 19 03/31/2023   NA 137 03/31/2023   K 4.1 03/31/2023   CL 101 03/31/2023   CREATININE 1.08 03/31/2023   BUN 19 03/31/2023   CO2 25 03/31/2023   TSH 1.30 03/31/2023   PSA 5.64 (H) 03/31/2023   HGBA1C 4.8 09/30/2022    DG Chest 2 View Addendum Date: 06/08/2015 ADDENDUM REPORT: 06/08/2015 10:30 ADDENDUM: Impression should read:  No edema or consolidation. Electronically Signed   By: Elsie Repine III M.D.   On: 06/08/2015 10:30  Result Date: 06/08/2015 CLINICAL DATA:  Cardiac palpitations for 2 weeks.  Hypertension. EXAM: CHEST  2 VIEW COMPARISON:  None. FINDINGS: Lungs are clear. Heart size and pulmonary vascularity are normal. No adenopathy. There is degenerative change thoracic spine. IMPRESSION: Edema or consolidation. Electronically Signed: By: Elsie Repine III M.D. On: 06/08/2015 10:26    Assessment & Plan:   Problem List Items Addressed This Visit     BPV (benign positional vertigo)   Recurrent x years      Relevant Orders   CBC with Differential/Platelet   Elevated PSA   Check PSA      Relevant Orders   PSA   Hyperglycemia   Check A1c      Hypertension   On Tribenzor, Metoprolol  I suggested carotid US , coronary calcium CT score - pt declined Dr Verlin q 12 mo      Relevant Orders   CBC with Differential/Platelet   Lipid panel   Low back pain   Using Olive oil and lemon juice 1 tbsp daily On Tramadol  prn Meloxicam  - rare use prn      Relevant Medications   meloxicam  (MOBIC ) 15 MG tablet   traMADol  (ULTRAM ) 50 MG tablet   Other Relevant Orders   CBC with Differential/Platelet   Lipid panel   Well adult exam - Primary    We discussed age appropriate health related issues, including available/recomended screening tests and vaccinations. Labs were ordered to be later reviewed . All questions were answered. We discussed one or more of the following - seat belt use, use of sunscreen/sun exposure exercise, fall risk  reduction, second hand smoke exposure, firearm use and storage, seat belt use, a need for adhering to healthy diet and exercise. Labs were ordered.  All questions were answered. Colon 2017 Dr Kristie - due in 2027 Eye exam q 12-24 mo Teeth q 6 mo  Relevant Orders   TSH   Urinalysis   CBC with Differential/Platelet   Lipid panel   PSA   Comprehensive metabolic panel with GFR      Meds ordered this encounter  Medications   meloxicam  (MOBIC ) 15 MG tablet    Sig: Take 1 tablet (15 mg total) by mouth daily as needed for pain.    Dispense:  30 tablet    Refill:  2   traMADol  (ULTRAM ) 50 MG tablet    Sig: TAKE 1 TO 2 TABLETS BY MOUTH DAILY FOR PAIN    Dispense:  120 tablet    Refill:  2      Follow-up: Return in about 6 months (around 11/14/2024) for f/u with PCP.  Marolyn Noel, MD

## 2024-05-17 NOTE — Assessment & Plan Note (Signed)
 Check A1c.

## 2024-05-17 NOTE — Assessment & Plan Note (Signed)
 Using Olive oil and lemon juice 1 tbsp daily On Tramadol  prn Meloxicam  - rare use prn

## 2024-05-17 NOTE — Assessment & Plan Note (Signed)
 Check PSA. ?

## 2024-05-17 NOTE — Assessment & Plan Note (Signed)
 Recurrent x years

## 2024-05-17 NOTE — Assessment & Plan Note (Signed)
On Tribenzor, Metoprolol I suggested carotid US, coronary calcium CT score - pt declined Dr Clifton James q 12 mo

## 2024-05-17 NOTE — Assessment & Plan Note (Signed)
  We discussed age appropriate health related issues, including available/recomended screening tests and vaccinations. Labs were ordered to be later reviewed . All questions were answered. We discussed one or more of the following - seat belt use, use of sunscreen/sun exposure exercise, fall risk reduction, second hand smoke exposure, firearm use and storage, seat belt use, a need for adhering to healthy diet and exercise. Labs were ordered.  All questions were answered. Colon 2017 Dr Kristie - due in 2027 Eye exam q 12-24 mo Teeth q 6 mo

## 2024-05-20 ENCOUNTER — Ambulatory Visit: Payer: Self-pay | Admitting: Internal Medicine

## 2024-07-19 ENCOUNTER — Other Ambulatory Visit: Payer: Self-pay | Admitting: Internal Medicine

## 2024-07-25 ENCOUNTER — Ambulatory Visit: Payer: Self-pay

## 2024-07-25 ENCOUNTER — Ambulatory Visit: Admitting: Internal Medicine

## 2024-07-25 NOTE — Telephone Encounter (Signed)
 done

## 2024-07-25 NOTE — Telephone Encounter (Signed)
 FYI Only or Action Required?: Action required by provider: request for appointment. This afternoons appt needs to be changed to a virtual visit. Pt cannot come in d/t road conditions.

## 2024-07-25 NOTE — Telephone Encounter (Signed)
 FYI Only or Action Required?: FYI only for provider: appointment scheduled on 07/25/2024.  Patient was last seen in primary care on 05/17/2024 by Plotnikov, Karlynn GAILS, MD.  Called Nurse Triage reporting Constipation.  Symptoms began several weeks ago.  Triage Disposition: See HCP Within 4 Hours (Or PCP Triage)  Patient/caregiver understands and will follow disposition?: Yes                  Message from [facility] sent at 07/25/2024  9:05 AM EST  Reason for Triage: is having severe constipation for the past 2 weeks, stomach growling, is having stomach pains.   Reason for Disposition  [1] Constant abdominal pain AND [2] present > 2 hours  Answer Assessment - Initial Assessment Questions This RN scheduled pt an appointment today in office.   Last bm: Friday (pt had taken a laxative) Constipation for 2 weeks Stomachache like a tooth ache bothers pt mostly at night During day pt will take Motrin  and it goes away 5/10 pain level constant Stomach is making a lot of noise like a gurgling/growling sound like it's upset Intermittent bloating Passing gas  Denies: Vomiting Rectal pain Black, tarry, bloody stools Cramping Nausea Fever, chills  Protocols used: Constipation-A-AH

## 2024-07-26 ENCOUNTER — Encounter: Payer: Self-pay | Admitting: Internal Medicine

## 2024-07-26 ENCOUNTER — Ambulatory Visit: Admitting: Internal Medicine

## 2024-07-26 VITALS — BP 122/72 | HR 73 | Temp 98.0°F | Ht 70.0 in | Wt 191.0 lb

## 2024-07-26 DIAGNOSIS — K5901 Slow transit constipation: Secondary | ICD-10-CM | POA: Diagnosis not present

## 2024-07-26 DIAGNOSIS — K5900 Constipation, unspecified: Secondary | ICD-10-CM | POA: Insufficient documentation

## 2024-07-26 DIAGNOSIS — I10 Essential (primary) hypertension: Secondary | ICD-10-CM | POA: Diagnosis not present

## 2024-07-26 DIAGNOSIS — M5440 Lumbago with sciatica, unspecified side: Secondary | ICD-10-CM | POA: Diagnosis not present

## 2024-07-26 DIAGNOSIS — R1010 Upper abdominal pain, unspecified: Secondary | ICD-10-CM

## 2024-07-26 DIAGNOSIS — R634 Abnormal weight loss: Secondary | ICD-10-CM | POA: Diagnosis not present

## 2024-07-26 LAB — CBC WITH DIFFERENTIAL/PLATELET
Basophils Absolute: 0.1 10*3/uL (ref 0.0–0.1)
Basophils Relative: 0.5 % (ref 0.0–3.0)
Eosinophils Absolute: 0 10*3/uL (ref 0.0–0.7)
Eosinophils Relative: 0.2 % (ref 0.0–5.0)
HCT: 36.6 % — ABNORMAL LOW (ref 39.0–52.0)
Hemoglobin: 12.6 g/dL — ABNORMAL LOW (ref 13.0–17.0)
Lymphocytes Relative: 12.2 % (ref 12.0–46.0)
Lymphs Abs: 1.8 10*3/uL (ref 0.7–4.0)
MCHC: 34.4 g/dL (ref 30.0–36.0)
MCV: 102 fl — ABNORMAL HIGH (ref 78.0–100.0)
Monocytes Absolute: 0.8 10*3/uL (ref 0.1–1.0)
Monocytes Relative: 5.4 % (ref 3.0–12.0)
Neutro Abs: 11.8 10*3/uL — ABNORMAL HIGH (ref 1.4–7.7)
Neutrophils Relative %: 81.7 % — ABNORMAL HIGH (ref 43.0–77.0)
Platelets: 254 10*3/uL (ref 150.0–400.0)
RBC: 3.58 Mil/uL — ABNORMAL LOW (ref 4.22–5.81)
RDW: 12.4 % (ref 11.5–15.5)
WBC: 14.5 10*3/uL — ABNORMAL HIGH (ref 4.0–10.5)

## 2024-07-26 LAB — URINALYSIS
Bilirubin Urine: NEGATIVE
Hgb urine dipstick: NEGATIVE
Ketones, ur: NEGATIVE
Leukocytes,Ua: NEGATIVE
Nitrite: NEGATIVE
Specific Gravity, Urine: 1.025 (ref 1.000–1.030)
Total Protein, Urine: NEGATIVE
Urine Glucose: NEGATIVE
Urobilinogen, UA: 1 (ref 0.0–1.0)
pH: 6 (ref 5.0–8.0)

## 2024-07-26 LAB — COMPREHENSIVE METABOLIC PANEL WITH GFR
ALT: 18 U/L (ref 3–53)
AST: 20 U/L (ref 5–37)
Albumin: 4.4 g/dL (ref 3.5–5.2)
Alkaline Phosphatase: 58 U/L (ref 39–117)
BUN: 24 mg/dL — ABNORMAL HIGH (ref 6–23)
CO2: 28 meq/L (ref 19–32)
Calcium: 10.2 mg/dL (ref 8.4–10.5)
Chloride: 100 meq/L (ref 96–112)
Creatinine, Ser: 1.25 mg/dL (ref 0.40–1.50)
GFR: 55.2 mL/min — ABNORMAL LOW
Glucose, Bld: 130 mg/dL — ABNORMAL HIGH (ref 70–99)
Potassium: 3.9 meq/L (ref 3.5–5.1)
Sodium: 136 meq/L (ref 135–145)
Total Bilirubin: 0.6 mg/dL (ref 0.2–1.2)
Total Protein: 7.7 g/dL (ref 6.0–8.3)

## 2024-07-26 LAB — TSH: TSH: 1.24 u[IU]/mL (ref 0.35–5.50)

## 2024-07-26 LAB — SEDIMENTATION RATE: Sed Rate: 15 mm/h (ref 0–20)

## 2024-07-26 LAB — LIPASE: Lipase: 27 U/L (ref 11.0–59.0)

## 2024-07-26 MED ORDER — HYOSCYAMINE SULFATE 0.125 MG PO TABS: 0.1250 mg | ORAL_TABLET | Freq: Four times a day (QID) | ORAL | 1 refills | Status: AC | PRN

## 2024-07-26 MED ORDER — PANTOPRAZOLE SODIUM 40 MG PO TBEC: 40.0000 mg | DELAYED_RELEASE_TABLET | Freq: Every day | ORAL | 3 refills | Status: AC

## 2024-07-26 NOTE — Assessment & Plan Note (Signed)
 Using Olive oil and lemon juice 1 tbsp daily On Tramadol  prn Meloxicam  - rare use prn

## 2024-07-26 NOTE — Patient Instructions (Signed)
 Miralax 3-4 scoops ian a bottle of water and Dulcolax if needed (tablets or suppositories).

## 2024-07-26 NOTE — Assessment & Plan Note (Signed)
On Tribenzor, Metoprolol I suggested carotid US, coronary calcium CT score - pt declined Dr Clifton James q 12 mo

## 2024-07-26 NOTE — Assessment & Plan Note (Signed)
 New upper abd pain x 1 month after eating w/neighbors. Off and on. Worse in bed 5-6/10. Motrin  helps. Loss of appetite. Feels like food would make his stomach upset. Pt lost wt a little...  Obtain labs w/CMET, lipase etc  Will order abd CT Hyoscyamine  prn Pantoprazole  qd

## 2024-07-26 NOTE — Assessment & Plan Note (Signed)
?  etiology Labs Abd CT

## 2024-07-26 NOTE — Assessment & Plan Note (Signed)
 Miralax 3-4 scoops ian a bottle of water and Dulcolax if needed (tablets or suppositories).

## 2024-07-26 NOTE — Progress Notes (Signed)
 "  Subjective:  Patient ID: Patrick Morgan, male    DOB: March 30, 1946  Age: 79 y.o. MRN: [id number]  CC: Constipation (All started new years day hurting all the time more so at night)   HPI ROXAS CLYMER presents for upper abd pain x 1 month after eating w/neighbors. Off and on. Worse in bed 5-6/10. Motrin  helps. Loss of appetite. Feels like food would make his stomach upset. Pt lost wt a little...   Outpatient Medications Prior to Visit  Medication Sig Dispense Refill   aspirin 81 MG tablet Take 81 mg by mouth daily.     meloxicam  (MOBIC ) 15 MG tablet TAKE 1 TABLET(15 MG) BY MOUTH DAILY AS NEEDED FOR PAIN 30 tablet 2   metoprolol  tartrate (LOPRESSOR ) 25 MG tablet TAKE 1 TABLET(25 MG) BY MOUTH TWICE DAILY 180 tablet 3   Multiple Vitamin (MULTIVITAMIN) tablet Take 1 tablet by mouth daily.     Olmesartan -amLODIPine -HCTZ 40-10-25 MG TABS Take 1 tablet by mouth daily. 90 tablet 3   traMADol  (ULTRAM ) 50 MG tablet TAKE 1 TO 2 TABLETS BY MOUTH DAILY FOR PAIN 120 tablet 2   VITAMIN D  PO Take 2,000 Units by mouth daily.     No facility-administered medications prior to visit.    ROS: Review of Systems  Constitutional:  Positive for unexpected weight change. Negative for appetite change and fatigue.  HENT:  Negative for congestion, nosebleeds, sneezing, sore throat and trouble swallowing.   Eyes:  Negative for itching and visual disturbance.  Respiratory:  Negative for cough.   Cardiovascular:  Negative for chest pain, palpitations and leg swelling.  Gastrointestinal:  Positive for abdominal pain. Negative for abdominal distention, blood in stool, diarrhea and nausea.  Genitourinary:  Negative for frequency and hematuria.  Musculoskeletal:  Negative for back pain, gait problem, joint swelling and neck pain.  Skin:  Negative for rash.  Neurological:  Negative for dizziness, tremors, speech difficulty and weakness.  Psychiatric/Behavioral:  Negative for agitation, decreased concentration,  dysphoric mood, sleep disturbance and suicidal ideas. The patient is not nervous/anxious.     Objective:  BP 122/72   Pulse 73   Temp 98 F (36.7 C) (Oral)   Ht 5' 10 (1.778 m)   Wt 191 lb (86.6 kg)   SpO2 100%   BMI 27.41 kg/m   BP Readings from Last 3 Encounters:  07/26/24 122/72  05/17/24 (!) 126/58  11/17/23 114/62    Wt Readings from Last 3 Encounters:  07/26/24 191 lb (86.6 kg)  05/17/24 199 lb 9.6 oz (90.5 kg)  11/17/23 195 lb (88.5 kg)    Physical Exam Constitutional:      General: He is not in acute distress.    Appearance: Normal appearance. He is well-developed.     Comments: NAD  Eyes:     Conjunctiva/sclera: Conjunctivae normal.     Pupils: Pupils are equal, round, and reactive to light.  Neck:     Thyroid : No thyromegaly.     Vascular: No JVD.  Cardiovascular:     Rate and Rhythm: Normal rate and regular rhythm.     Heart sounds: Normal heart sounds. No murmur heard.    No friction rub. No gallop.  Pulmonary:     Effort: Pulmonary effort is normal. No respiratory distress.     Breath sounds: Normal breath sounds. No wheezing or rales.  Chest:     Chest wall: No tenderness.  Abdominal:     General: Bowel sounds are normal. There is no  distension.     Palpations: Abdomen is soft. There is no mass.     Tenderness: There is no abdominal tenderness. There is no guarding or rebound.  Musculoskeletal:        General: No tenderness. Normal range of motion.     Cervical back: Normal range of motion.  Lymphadenopathy:     Cervical: No cervical adenopathy.  Skin:    General: Skin is warm and dry.     Findings: No rash.  Neurological:     Mental Status: He is alert and oriented to person, place, and time.     Cranial Nerves: No cranial nerve deficit.     Motor: No abnormal muscle tone.     Coordination: Coordination normal.     Gait: Gait normal.     Deep Tendon Reflexes: Reflexes are normal and symmetric.  Psychiatric:        Behavior: Behavior  normal.        Thought Content: Thought content normal.        Judgment: Judgment normal.     Lab Results  Component Value Date   WBC 6.5 05/17/2024   HGB 13.9 05/17/2024   HCT 39.9 05/17/2024   PLT 193.0 05/17/2024   GLUCOSE 100 (H) 05/17/2024   CHOL 165 05/17/2024   TRIG 89.0 05/17/2024   HDL 40.40 05/17/2024   LDLCALC 107 (H) 05/17/2024   ALT 16 05/17/2024   AST 19 05/17/2024   NA 139 05/17/2024   K 4.1 05/17/2024   CL 103 05/17/2024   CREATININE 1.09 05/17/2024   BUN 19 05/17/2024   CO2 27 05/17/2024   TSH 1.20 05/17/2024   PSA 4.24 (H) 05/17/2024   HGBA1C 4.8 09/30/2022    DG Chest 2 View Addendum Date: 06/08/2015 ADDENDUM REPORT: 06/08/2015 10:30 ADDENDUM: Impression should read:  No edema or consolidation. Electronically Signed   By: Elsie Repine III M.D.   On: 06/08/2015 10:30  Result Date: 06/08/2015 CLINICAL DATA:  Cardiac palpitations for 2 weeks.  Hypertension. EXAM: CHEST  2 VIEW COMPARISON:  None. FINDINGS: Lungs are clear. Heart size and pulmonary vascularity are normal. No adenopathy. There is degenerative change thoracic spine. IMPRESSION: Edema or consolidation. Electronically Signed: By: Elsie Repine III M.D. On: 06/08/2015 10:26    Assessment & Plan:   Problem List Items Addressed This Visit     Hypertension   On Tribenzor, Metoprolol  I suggested carotid US , coronary calcium CT score - pt declined Dr Verlin q 12 mo      Low back pain   Using Olive oil and lemon juice 1 tbsp daily On Tramadol  prn Meloxicam  - rare use prn      Constipation   Miralax 3-4 scoops ian a bottle of water and Dulcolax if needed (tablets or suppositories).       Upper abdominal pain - Primary   New upper abd pain x 1 month after eating w/neighbors. Off and on. Worse in bed 5-6/10. Motrin  helps. Loss of appetite. Feels like food would make his stomach upset. Pt lost wt a little...  Obtain labs w/CMET, lipase etc  Will order abd CT Hyoscyamine   prn Pantoprazole  qd      Relevant Orders   CBC with Differential/Platelet   Comprehensive metabolic panel with GFR   TSH   Urinalysis   Lipase   Sedimentation rate   CT ABDOMEN PELVIS W CONTRAST   Weight loss   ?etiology Labs Abd CT      Relevant Orders  CBC with Differential/Platelet   Comprehensive metabolic panel with GFR   TSH   Urinalysis   Lipase   Sedimentation rate   CT ABDOMEN PELVIS W CONTRAST      Meds ordered this encounter  Medications   pantoprazole  (PROTONIX ) 40 MG tablet    Sig: Take 1 tablet (40 mg total) by mouth daily.    Dispense:  90 tablet    Refill:  3   hyoscyamine  (LEVSIN ) 0.125 MG tablet    Sig: Take 1-2 tablets (0.125-0.25 mg total) by mouth every 6 (six) hours as needed for cramping.    Dispense:  100 tablet    Refill:  1      Follow-up: Return in about 2 weeks (around 08/09/2024) for a follow-up visit.  Marolyn Noel, MD "

## 2024-07-27 ENCOUNTER — Ambulatory Visit: Payer: Self-pay | Admitting: Internal Medicine
